# Patient Record
Sex: Male | Born: 1975
Health system: Southern US, Community
[De-identification: ages and names within clinical notes are randomized; demographics above are authoritative.]

## PROBLEM LIST (undated history)

## (undated) DIAGNOSIS — I10 Essential (primary) hypertension: Secondary | ICD-10-CM

## (undated) HISTORY — DX: Essential (primary) hypertension: I10

## (undated) HISTORY — PX: VASECTOMY: SHX75

---

## 2019-05-27 ENCOUNTER — Telehealth: Payer: Self-pay | Admitting: Internal Medicine
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
## 2019-07-12 DIAGNOSIS — Z3009 Encounter for other general counseling and advice on contraception: Secondary | ICD-10-CM | POA: Diagnosis not present

## 2019-08-02 DIAGNOSIS — Z302 Encounter for sterilization: Secondary | ICD-10-CM | POA: Diagnosis not present

## 2019-08-30 ENCOUNTER — Other Ambulatory Visit: Payer: Self-pay

## 2019-08-30 ENCOUNTER — Ambulatory Visit: Payer: Self-pay

## 2019-08-30 DIAGNOSIS — Z23 Encounter for immunization: Secondary | ICD-10-CM

## 2019-09-21 ENCOUNTER — Other Ambulatory Visit: Payer: Self-pay

## 2019-09-21 ENCOUNTER — Emergency Department
Admission: EM | Admit: 2019-09-21 | Discharge: 2019-09-21 | Disposition: A | Discharge status: Home / Self Care | Payer: No Typology Code available for payment source | Attending: Student in an Organized Health Care Education/Training Program | Admitting: Student in an Organized Health Care Education/Training Program

## 2019-09-21 ENCOUNTER — Encounter: Payer: Self-pay | Admitting: Emergency Medicine

## 2019-09-21 DIAGNOSIS — S6991XA Unspecified injury of right wrist, hand and finger(s), initial encounter: Secondary | ICD-10-CM | POA: Diagnosis present

## 2019-09-21 DIAGNOSIS — S60415A Abrasion of left ring finger, initial encounter: Secondary | ICD-10-CM | POA: Insufficient documentation

## 2019-09-21 DIAGNOSIS — Y990 Civilian activity done for income or pay: Secondary | ICD-10-CM | POA: Insufficient documentation

## 2019-09-21 DIAGNOSIS — Y9389 Activity, other specified: Secondary | ICD-10-CM | POA: Insufficient documentation

## 2019-09-21 DIAGNOSIS — Y929 Unspecified place or not applicable: Secondary | ICD-10-CM | POA: Insufficient documentation

## 2019-09-21 DIAGNOSIS — S60311A Abrasion of right thumb, initial encounter: Secondary | ICD-10-CM | POA: Diagnosis not present

## 2019-09-21 DIAGNOSIS — T148XXA Other injury of unspecified body region, initial encounter: Secondary | ICD-10-CM

## 2019-09-21 MED ORDER — SULFAMETHOXAZOLE-TRIMETHOPRIM 800-160 MG PO TABS: 1.0000 | ORAL_TABLET | Freq: Two times a day (BID) | ORAL | 0 refills | Status: DC

## 2019-09-21 MED ORDER — NEOSPORIN PLUS PAIN RELIEF MS 3.5-10000-10 EX CREA: TOPICAL_CREAM | Freq: Two times a day (BID) | CUTANEOUS | 0 refills | Status: DC

## 2019-09-21 MED ORDER — NAPROXEN 500 MG PO TABS: 500.0000 mg | ORAL_TABLET | Freq: Two times a day (BID) | ORAL | Status: DC

## 2019-09-21 MED ORDER — SULFAMETHOXAZOLE-TRIMETHOPRIM 800-160 MG PO TABS
1.0000 | ORAL_TABLET | Freq: Once | ORAL | Status: AC
  Administered 2019-09-21: 1 via ORAL
  Filled 2019-09-21: qty 1

## 2019-09-21 MED ORDER — NAPROXEN 500 MG PO TABS
500.0000 mg | ORAL_TABLET | Freq: Once | ORAL | Status: AC
  Administered 2019-09-21: 500 mg via ORAL
  Filled 2019-09-21: qty 1

## 2019-09-21 MED ORDER — BACITRACIN-NEOMYCIN-POLYMYXIN 400-5-5000 EX OINT
TOPICAL_OINTMENT | Freq: Once | CUTANEOUS | Status: AC
  Administered 2019-09-21: 1 via TOPICAL
  Filled 2019-09-21: qty 1
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
## 2019-09-21 NOTE — Discharge Instructions (Addendum)
Follow discharge care instruction take medication as directed. °

## 2019-10-01 ENCOUNTER — Ambulatory Visit: Payer: Self-pay

## 2019-10-02 ENCOUNTER — Ambulatory Visit: Payer: Self-pay | Admitting: Registered Nurse

## 2019-10-02 ENCOUNTER — Encounter: Payer: Self-pay | Admitting: Registered Nurse

## 2019-10-02 ENCOUNTER — Other Ambulatory Visit: Payer: Self-pay

## 2019-10-02 VITALS — BP 136/88 | HR 65 | Temp 98.4°F | Resp 16 | Ht 68.0 in | Wt 209.0 lb

## 2019-10-02 DIAGNOSIS — L03011 Cellulitis of right finger: Secondary | ICD-10-CM

## 2019-10-02 MED ORDER — MUPIROCIN 2 % EX OINT: 1.0000 "application " | TOPICAL_OINTMENT | Freq: Two times a day (BID) | CUTANEOUS | 0 refills | Status: DC

## 2019-10-02 MED ORDER — CLINDAMYCIN HCL 150 MG PO CAPS: 450.0000 mg | ORAL_CAPSULE | Freq: Three times a day (TID) | ORAL | 0 refills | Status: DC

## 2019-10-02 NOTE — Patient Instructions (Signed)
Paronychia Paronychia is an infection of the skin that surrounds a nail. It usually affects the skin around a fingernail, but it may also occur near a toenail. It often causes pain and swelling around the nail. In some cases, a collection of pus (abscess) can form near or under the nail.  This condition may develop suddenly, or it may develop gradually over a longer period. In most cases, paronychia is not serious, and it will clear up with treatment. What are the causes? This condition may be caused by bacteria or a fungus. These germs can enter the body through an opening in the skin, such as a cut or a hangnail. What increases the risk? This condition is more likely to develop in people who:  Get their hands wet often, such as those who work as dishwashers, bartenders, or nurses.  Bite their fingernails or suck their thumbs.  Trim their nails very short.  Have hangnails or injured fingertips.  Get manicures.  Have diabetes. What are the signs or symptoms? Symptoms of this condition include:  Redness and swelling of the skin near the nail.  Tenderness around the nail when you touch the area.  Pus-filled bumps under the skin at the base and sides of the nail (cuticle).  Fluid or pus under the nail.  Throbbing pain in the area. How is this diagnosed? This condition is diagnosed with a physical exam. In some cases, a sample of pus may be tested to determine what type of bacteria or fungus is causing the condition. How is this treated? Treatment depends on the cause and severity of your condition. If your condition is mild, it may clear up on its own in a few days or after soaking in warm water. If needed, treatment may include:  Antibiotic medicine, if your infection is caused by bacteria.  Antifungal medicine, if your infection is caused by a fungus.  A procedure to drain pus from an abscess.  Anti-inflammatory medicine (corticosteroids). Follow these instructions at  home: Wound care  Keep the affected area clean.  Soak the affected area in warm water, if told to do so by your health care provider. You may be told to do this for 20 minutes, 2-3 times a day.  Keep the area dry when you are not soaking it.  Do not try to drain an abscess yourself.  Follow instructions from your health care provider about how to take care of the affected area. Make sure you: ? Wash your hands with soap and water before you change your bandage (dressing). If soap and water are not available, use hand sanitizer. ? Change your dressing as told by your health care provider.  If you had an abscess drained, check the area every day for signs of infection. Check for: ? Redness, swelling, or pain. ? Fluid or blood. ? Warmth. ? Pus or a bad smell. Medicines   Take over-the-counter and prescription medicines only as told by your health care provider.  If you were prescribed an antibiotic medicine, take it as told by your health care provider. Do not stop taking the antibiotic even if you start to feel better. General instructions  Avoid contact with harsh chemicals.  Do not pick at the affected area. Prevention  To prevent this condition from happening again: ? Wear rubber gloves when washing dishes or doing other tasks that require your hands to get wet. ? Wear gloves if your hands might come in contact with cleaners or other chemicals. ? Avoid   injuring your nails or fingertips. ? Do not bite your nails or tear hangnails. ? Do not cut your nails very short. ? Do not cut your cuticles. ? Use clean nail clippers or scissors when trimming nails. Contact a health care provider if:  Your symptoms get worse or do not improve with treatment.  You have continued or increased fluid, blood, or pus coming from the affected area.  Your finger or knuckle becomes swollen or difficult to move. Get help right away if you have:  A fever or chills.  Redness spreading away from  the affected area.  Joint or muscle pain. Summary  Paronychia is an infection of the skin that surrounds a nail. It often causes pain and swelling around the nail. In some cases, a collection of pus (abscess) can form near or under the nail.  This condition may be caused by bacteria or a fungus. These germs can enter the body through an opening in the skin, such as a cut or a hangnail.  If your condition is mild, it may clear up on its own in a few days. If needed, treatment may include medicine or a procedure to drain pus from an abscess.  To prevent this condition from happening again, wear gloves if doing tasks that require your hands to get wet or to come in contact with chemicals. Also avoid injuring your nails or fingertips. This information is not intended to replace advice given to you by your health care provider. Make sure you discuss any questions you have with your health care provider. Document Released: 04/12/2001 Document Revised: 11/03/2017 Document Reviewed: 10/30/2017 Elsevier Patient Education  2020 [employer] Abrasion  An abrasion is a cut or a scrape on the outer surface of the skin. An abrasion does not go through all the layers of the skin. It is important to care for your abrasion properly to prevent infection. What are the causes? This condition is caused by falling on or gliding across the ground or another surface. When your skin rubs on something, the outer and inner layers of skin may rub off. What are the signs or symptoms? The main symptom of this condition is a cut or a scrape. The scrape may be bleeding, or it may appear red or pink. If the abrasion was caused by a fall, there may be a bruise under the cut or scrape. How is this diagnosed? An abrasion is diagnosed with a physical exam. How is this treated? Treatment for this condition depends on how large and deep the abrasion is. In most cases:  Your abrasion will be cleaned with water and mild soap.  This is done to remove any dirt or debris (such as particles of glass or rock) that may be stuck in the wound.  An antibiotic ointment may be applied to the abrasion to help prevent infection.  A bandage (dressing) may be placed on the abrasion to keep it clean. You may also need a tetanus shot. Follow these instructions at home: Medicines  Take or apply over-the-counter and prescription medicines only as told by your health care provider.  If you were prescribed an antibiotic medicine, apply it as told by your health care provider. Wound care  Clean the wound 2-3 times a day, or as directed by your health care provider. To do this, wash the wound with mild soap and water, rinse off the soap, and pat the wound dry with a clean towel. Do not rub the wound.  Keep the  dressing clean and dry as told by your health care provider.  There are many different ways to close and cover a wound. Follow instructions from your health care provider about: ? Caring for your wound. ? Changing and removing your dressing. You may have to change your dressing one or more times a day, or as directed by your health care provider.  Check your wound every day for signs of infection. Check for: ? Redness, particularly a red streak that spreads out from the wound. ? Swelling or increased pain. ? Warmth. ? Fluid, pus, or a bad smell.  If directed, put ice on the injured area to reduce pain and swelling: ? Put ice in a plastic bag. ? Place a towel between your skin and the bag. ? Leave the ice on for 20 minutes, 2-3 times a day. General instructions  Do not take baths, swim, or use a hot tub until your health care provider says it is okay to do so.  If possible, raise (elevate) the injured area above the level of your heart while you are sitting or lying down. This will reduce pain and swelling.  Keep all follow-up visits as directed by your health care provider. This is important. Contact a health care  provider if:  You received a tetanus shot, and you have swelling, severe pain, redness, or bleeding at the injection site.  Your pain is not controlled with medicine.  You have redness, swelling, or more pain at the site of your wound. Get help right away if:  You have a red streak spreading away from your wound.  You have a fever.  You have fluid, blood, or pus coming from your wound.  You notice a bad smell coming from your wound or your dressing. Summary  An abrasion is a cut or a scrape on the outer surface of the skin. An abrasion does not go through all the layers of the skin.  Care for your abrasion properly to prevent infection.  Clean the wound with mild soap and water 2-3 times a day. Follow instructions from your health care provider about taking medicines and changing your bandage (dressing).  Contact your health care provider if you have redness, swelling or more pain in the wound area.  Get help right away if you have a fever or if you have fluid, blood, pus, a bad smell, or a red streak coming from the wound. This information is not intended to replace advice given to you by your health care provider. Make sure you discuss any questions you have with your health care provider. Document Released: 07/27/2005 Document Revised: 09/29/2017 Document Reviewed: 05/31/2017 Elsevier Patient Education  2020 [employer].

## 2019-10-02 NOTE — Progress Notes (Signed)
Subjective:    Patient ID: Michael Garrison, male    DOB: Aug 29, 1976, 43 y.o.   MRN: [id number]  43y/o hispanic married male established patient here for follow up abrasion right thumb having pain/swollen and purulent drainage still even though taking bactrim DS po BID and naproxen from ER and applying neosporin once a day.  He has been washing with iodine daily also.  Pain especially if he bumps affected area on work gear/putting hand in pocket.  Denied fever/chills/n/v/d/hand weakness.  Feels able to perform all work duties.     Review of Systems  Constitutional: Negative for activity change, appetite change, chills, diaphoresis, fatigue and fever.  HENT: Negative for congestion, trouble swallowing and voice change.   Eyes: Negative for photophobia and visual disturbance.  Respiratory: Negative for cough, shortness of breath, wheezing and stridor.   Cardiovascular: Negative for chest pain and leg swelling.  Gastrointestinal: Negative for abdominal pain, blood in stool, constipation, diarrhea, nausea and vomiting.  Genitourinary: Negative for difficulty urinating.  Musculoskeletal: Positive for myalgias. Negative for arthralgias, back pain, gait problem, joint swelling, neck pain and neck stiffness.  Skin: Positive for color change, rash and wound.  Allergic/Immunologic: Negative for environmental allergies, food allergies and immunocompromised state.  Neurological: Negative for tremors, weakness and numbness.  Hematological: Negative for adenopathy. Does not bruise/bleed easily.  Psychiatric/Behavioral: Negative for agitation, confusion and sleep disturbance.       Objective:   Physical Exam Vitals signs reviewed.  Constitutional:      General: He is awake. He is not in acute distress.    Appearance: Normal appearance. He is well-developed, well-groomed and normal weight. He is not ill-appearing, toxic-appearing or diaphoretic.  HENT:     Head: Normocephalic and atraumatic.   Jaw: There is normal jaw occlusion.     Right Ear: Hearing and external ear normal.     Left Ear: Hearing and external ear normal.     Nose: Nose normal.     Mouth/Throat:     Lips: Pink. No lesions.     Mouth: Mucous membranes are moist. No lacerations, oral lesions or angioedema.     Dentition: No gum lesions.     Tongue: No lesions. Tongue does not deviate from midline.     Palate: No mass and lesions.     Pharynx: Oropharynx is clear. Uvula midline.  Eyes:     General: Lids are normal. Vision grossly intact. Gaze aligned appropriately. No visual field deficit or scleral icterus.    Extraocular Movements: Extraocular movements intact.     Conjunctiva/sclera: Conjunctivae normal.     Pupils: Pupils are equal, round, and reactive to light.  Neck:     Musculoskeletal: Normal range of motion and neck supple. Normal range of motion. No edema, erythema, neck rigidity, crepitus, injury, pain with movement or torticollis.     Trachea: Trachea normal.  Cardiovascular:     Rate and Rhythm: Normal rate and regular rhythm.     Pulses:          Radial pulses are 2+ on the right side and 2+ on the left side.     Heart sounds: Normal heart sounds.  Pulmonary:     Effort: Pulmonary effort is normal. No respiratory distress.     Breath sounds: Normal breath sounds and air entry. No stridor, decreased air movement or transmitted upper airway sounds. No decreased breath sounds, wheezing, rhonchi or rales.     Comments: No cough observed in exam room; spoke full  sentences without difficulty; wearing cloth mask due to covid 19 pandemic Abdominal:     Palpations: Abdomen is soft.  Musculoskeletal: Normal range of motion.        General: Swelling, tenderness, deformity and signs of injury present.     Right shoulder: Normal.     Left shoulder: Normal.     Right elbow: Normal.    Left elbow: Normal.     Right wrist: Normal.     Left wrist: Normal.     Right hip: Normal.     Left hip: Normal.      Right knee: Normal.     Left knee: Normal.     Right forearm: Normal.     Right hand: He exhibits tenderness and swelling. He exhibits normal range of motion, no bony tenderness, normal two-point discrimination, normal capillary refill, no deformity and no laceration. Normal sensation noted. Normal strength noted. He exhibits no finger abduction, no thumb/finger opposition and no wrist extension trouble.     Left hand: Normal.       Hands:     Right lower leg: No edema.     Left lower leg: No edema.     Comments: Full arom all fingers/hands/wrist/shoulders/neck without difficulty bilaterally; hand grasp equal bilaterally 5/5  Lymphadenopathy:     Cervical: No cervical adenopathy.     Right cervical: No superficial cervical adenopathy.    Left cervical: No superficial cervical adenopathy.  Skin:    General: Skin is warm.     Capillary Refill: Capillary refill takes less than 2 seconds.     Coloration: Skin is not ashen, cyanotic, jaundiced, mottled, pale or sallow.     Findings: Abrasion, abscess, erythema, signs of injury, rash and wound present. No acne, bruising, burn, ecchymosis, laceration, lesion or petechiae. Rash is macular, papular, pustular and scaling. Rash is not crusting, nodular, purpuric, urticarial or vesicular.     Nails: There is no clubbing.   Neurological:     General: No focal deficit present.     Mental Status: He is alert and oriented to person, place, and time. Mental status is at baseline.     GCS: GCS eye subscore is 4. GCS verbal subscore is 5. GCS motor subscore is 6.     Cranial Nerves: Cranial nerves are intact. No cranial nerve deficit, dysarthria or facial asymmetry.     Sensory: Sensation is intact. No sensory deficit.     Motor: Motor function is intact. No weakness, tremor, atrophy, abnormal muscle tone or seizure activity.     Coordination: Coordination is intact. Coordination normal.     Gait: Gait is intact. Gait normal.     Comments: Gait sure and  steady in hallway; on/off exam table without difficulty; bilateral hand grasp/upper and lower extremity strentgth 5/5  Psychiatric:        Attention and Perception: Attention and perception normal.        Mood and Affect: Mood and affect normal.        Speech: Speech normal.        Behavior: Behavior normal. Behavior is cooperative.        Thought Content: Thought content normal.        Cognition and Memory: Cognition and memory normal.        Judgment: Judgment normal.    Fingertips calloused with cracked skin both hands       Assessment & Plan:  A-abrasion cellulitis/paronychia right thumb; nail damage and callous/dry skin  Start epsom  salt soaks after work; finish bactrim ds po BID (missed a couple doses); avoid biting hangnails or picking at dry skin; start using emollient TID due to frequent handwashing and hand sanitizer use skin very dry/calloused/scaling; Avoid picking at affected area.  Continue iodine over affected thumb after washing.  Allow iodine to air dry.  Do not use full strength hydrogen peroxide on wounds after initial cleaning after injury as delays wound healing.  Start mupirocin instead of neosporin BID until healed/rash resolved electronic Rx to his pharmacy of choice #1 RF0  If no improvement with 48 hours of epsom salt soaks and mupirocin start clindamycin 450mg  po TID x 7 days #  RF0  May stop at day 5 if all swelling/rash/pain resolved day 5; no restrictions for duty patient feels able to perform all duties.  Epsom salt warm/hot soaks 2-3 times per day for 20 minutes. Monitor for red streaks, fever, worsening pain in affected extremity.  Purulent discharge may increase in the next 24 hours but should decrease and resolve with new ointment, epsom salt soaks, and oral antibiotics. Follow up with provider if worsening pain, red streaks, pain, and discharge after taking clindamycin for 48 hours or if pain swelling develops in his hand also. Patient given printed Exit[employer]  handout on paronychia. Patient verbalized understanding of instructions, agreed with plan of care and had no further questions at this time.

## 2019-10-08 ENCOUNTER — Telehealth: Payer: Self-pay | Admitting: General Practice

## 2019-10-08 DIAGNOSIS — L03011 Cellulitis of right finger: Secondary | ICD-10-CM

## 2019-10-08 DIAGNOSIS — R197 Diarrhea, unspecified: Secondary | ICD-10-CM

## 2019-10-08 NOTE — Telephone Encounter (Signed)
He was prescribed Clindamycin hcl for his infected finger. He has diarrhea as a side effect. He read that diarrhea is a side effect on the information sheet he was given at the pharmacy. He stopped taking it last night. He has had diarrhea since last Thursday.

## 2019-10-08 NOTE — Telephone Encounter (Signed)
Patient contacted via telephone.  Patient started having diarrhea the day after he started clindamycin Thursday last week.  He thought he had eaten bad chicken nuggets at [facility] but finally last night realized diarrhea worsened after taking clindamycin and read on medication information sheet that diarrhea can be a side effect and contacted clinic today.  He has been hydrating with powerade/pedialyte/chicken noodle soup.  At home today day off.  Feeling a little better.  Denied dizzyness/vomiting; voiding regularly.  His thumb infection doing better with mupirocin and clindamycin 60-70% healed per patient.  Going to hold oral antibiotics due to diarrhea and patient to continue mupirocin topical BID and restart epsom salt soaks BID until cellulitis resolved.  If worsening patient to contact clinic or if diarrhea not resolving with immodium 2mg  po prn take after a stool up to 8 tabs (16mg ) per 24 hours maximum OTC or peptobismol per manufacturer instructions.  Patient has peptobismol at home but has not taken it yet.  Discussed hydration most important and avoid large portions dairy/meat/juice/spicy/fried foods as these can worsen diarrhea.  Follow up if no improvement with plan of care x 24 hours or if thumb infection worsening e.g. increased pain/swelling, purulent discharge.  Patient denied discharge since I saw him and pain and swelling resolving.  Patient verbalized understanding information/instructions, agreed with plan of care and had no further questions at this time.

## 2019-10-11 DIAGNOSIS — S62002A Unspecified fracture of navicular [scaphoid] bone of left wrist, initial encounter for closed fracture: Secondary | ICD-10-CM | POA: Diagnosis not present

## 2019-10-11 DIAGNOSIS — M25461 Effusion, right knee: Secondary | ICD-10-CM | POA: Diagnosis not present

## 2019-10-11 DIAGNOSIS — M25462 Effusion, left knee: Secondary | ICD-10-CM | POA: Diagnosis not present

## 2020-04-09 NOTE — Progress Notes (Signed)
Scheduled to complete physical 04/20/20 with Rhonda Summers, PA-C.  AMD 

## 2020-04-10 ENCOUNTER — Other Ambulatory Visit: Payer: Self-pay

## 2020-04-10 ENCOUNTER — Ambulatory Visit: Payer: Self-pay

## 2020-04-10 DIAGNOSIS — Z0000 Encounter for general adult medical examination without abnormal findings: Secondary | ICD-10-CM

## 2020-04-10 LAB — POCT URINALYSIS DIPSTICK
Bilirubin, UA: NEGATIVE
Blood, UA: NEGATIVE
Glucose, UA: NEGATIVE
Ketones, UA: NEGATIVE
Leukocytes, UA: NEGATIVE
Nitrite, UA: NEGATIVE
Protein, UA: NEGATIVE
Spec Grav, UA: 1.015 (ref 1.010–1.025)
Urobilinogen, UA: 0.2 E.U./dL
pH, UA: 6 (ref 5.0–8.0)

## 2020-04-11 LAB — CMP12+LP+TP+TSH+6AC+PSA+CBC…
ALT: 32 IU/L (ref 0–44)
AST: 24 IU/L (ref 0–40)
Albumin/Globulin Ratio: 1.9 (ref 1.2–2.2)
Albumin: 4.6 g/dL (ref 4.0–5.0)
Alkaline Phosphatase: 70 IU/L (ref 48–121)
BUN/Creatinine Ratio: 18 (ref 9–20)
BUN: 16 mg/dL (ref 6–24)
Basophils Absolute: 0 10*3/uL (ref 0.0–0.2)
Basos: 0 %
Bilirubin Total: 0.6 mg/dL (ref 0.0–1.2)
Calcium: 9.2 mg/dL (ref 8.7–10.2)
Chloride: 103 mmol/L (ref 96–106)
Chol/HDL Ratio: 3.4 ratio (ref 0.0–5.0)
Cholesterol, Total: 135 mg/dL (ref 100–199)
Creatinine, Ser: 0.87 mg/dL (ref 0.76–1.27)
EOS (ABSOLUTE): 0.1 10*3/uL (ref 0.0–0.4)
Eos: 2 %
Estimated CHD Risk: 0.5 times avg. (ref 0.0–1.0)
Free Thyroxine Index: 1.8 (ref 1.2–4.9)
GFR calc Af Amer: 120 mL/min/{1.73_m2} (ref >59)
GFR calc non Af Amer: 104 mL/min/{1.73_m2} (ref >59)
GGT: 23 IU/L (ref 0–65)
Globulin, Total: 2.4 g/dL (ref 1.5–4.5)
Glucose: 73 mg/dL (ref 65–99)
HDL: 40 mg/dL (ref >39)
Hematocrit: 42.7 % (ref 37.5–51.0)
Hemoglobin: 14.8 g/dL (ref 13.0–17.7)
Immature Grans (Abs): 0 10*3/uL (ref 0.0–0.1)
Immature Granulocytes: 0 %
Iron: 121 ug/dL (ref 38–169)
LDH: 211 IU/L (ref 121–224)
LDL Chol Calc (NIH): 73 mg/dL (ref 0–99)
Lymphocytes Absolute: 2.3 10*3/uL (ref 0.7–3.1)
Lymphs: 35 %
MCH: 32.2 pg (ref 26.6–33.0)
MCHC: 34.7 g/dL (ref 31.5–35.7)
MCV: 93 fL (ref 79–97)
Monocytes Absolute: 0.5 10*3/uL (ref 0.1–0.9)
Monocytes: 7 %
Neutrophils Absolute: 3.7 10*3/uL (ref 1.4–7.0)
Neutrophils: 56 %
Phosphorus: 3.3 mg/dL (ref 2.8–4.1)
Platelets: 175 10*3/uL (ref 150–450)
Potassium: 4.6 mmol/L (ref 3.5–5.2)
Prostate Specific Ag, Serum: 0.4 ng/mL (ref 0.0–4.0)
RBC: 4.6 x10E6/uL (ref 4.14–5.80)
RDW: 12.2 % (ref 11.6–15.4)
Sodium: 140 mmol/L (ref 134–144)
T3 Uptake Ratio: 27 % (ref 24–39)
T4, Total: 6.8 ug/dL (ref 4.5–12.0)
TSH: 0.687 u[IU]/mL (ref 0.450–4.500)
Total Protein: 7 g/dL (ref 6.0–8.5)
Triglycerides: 120 mg/dL (ref 0–149)
Uric Acid: 7.1 mg/dL (ref 3.8–8.4)
VLDL Cholesterol Cal: 22 mg/dL (ref 5–40)
WBC: 6.7 10*3/uL (ref 3.4–10.8)

## 2020-04-20 ENCOUNTER — Other Ambulatory Visit: Payer: Self-pay

## 2020-04-20 ENCOUNTER — Ambulatory Visit: Payer: Self-pay | Admitting: Emergency Medicine

## 2020-04-20 ENCOUNTER — Encounter: Payer: Self-pay | Admitting: Emergency Medicine

## 2020-04-20 VITALS — BP 148/86 | HR 54 | Temp 97.8°F | Resp 14 | Ht 67.0 in | Wt 183.0 lb

## 2020-04-20 DIAGNOSIS — Z0000 Encounter for general adult medical examination without abnormal findings: Secondary | ICD-10-CM

## 2020-04-20 DIAGNOSIS — B360 Pityriasis versicolor: Secondary | ICD-10-CM

## 2020-04-20 MED ORDER — SELENIUM SULFIDE 2.5 % EX LOTN: TOPICAL_LOTION | Freq: Every day | CUTANEOUS | Status: DC | PRN

## 2020-04-20 NOTE — Progress Notes (Signed)
I have reviewed the triage vital signs and the nursing notes.   HISTORY  Chief Complaint Employment Physical  HPI Michael Garrison is a 44 y.o. male for annual exam.       No past medical history on file.  There are no problems to display for this patient.   Past Surgical History:  Procedure Laterality Date  . VASECTOMY      Prior to Admission medications   Medication Sig Start Date End Date Taking? Authorizing Provider  mupirocin ointment (BACTROBAN) 2 % Apply 1 application topically 2 (two) times daily. Patient not taking: Reported on 04/20/2020 10/02/19   Barbaraann Barthel, NP  naproxen (NAPROSYN) 500 MG tablet Take 1 tablet (500 mg total) by mouth 2 (two) times daily with a meal. Patient not taking: Reported on 04/20/2020 09/21/19   Joni Reining, PA-C  neomycin-polymyxin-pramoxine (NEOSPORIN PLUS) 1 % cream Apply topically 2 (two) times daily. Patient not taking: Reported on 10/08/2019 09/21/19   Joni Reining, PA-C  sulfamethoxazole-trimethoprim (BACTRIM DS) 800-160 MG tablet Take 1 tablet by mouth 2 (two) times daily. Patient not taking: Reported on 10/08/2019 09/21/19   Joni Reining, PA-C    Allergies Patient has no known allergies.  No family history on file.  Social History Social History   Tobacco Use  . Smoking status: Never Smoker  . Smokeless tobacco: Never Used  Vaping Use  . Vaping Use: Never used  Substance Use Topics  . Alcohol use: Yes    Alcohol/week: 2.0 standard drinks    Types: 2 Cans of beer per week    Comment: Jul 2020  . Drug use: Never    Review of Systems Constitutional: No fever/chills Eyes: No visual changes. ENT: No sore throat. Cardiovascular: Denies chest pain. Respiratory: Denies shortness of breath. Gastrointestinal: No abdominal pain.  No nausea, no vomiting.  No diarrhea.  No constipation. Genitourinary: Negative for dysuria. Musculoskeletal: Negative for back pain. Skin: Negative for rash. Neurological:  Negative for headaches, focal weakness or numbness. ____________________________________________   PHYSICAL EXAM: Constitutional: Alert and oriented. Well appearing and in no acute distress. Eyes: Conjunctivae are normal. PERRL. EOMI. Head: Atraumatic. Nose: No congestion/rhinnorhea. Neck: No stridor.  No cervical tenderness on palpation posteriorly. Hematological/Lymphatic/Immunilogical: No cervical lymphadenopathy. Cardiovascular: Normal rate, regular rhythm. Grossly normal heart sounds.  Good peripheral circulation. Respiratory: Normal respiratory effort.  No retractions. Lungs CTAB. Gastrointestinal: Soft and nontender. No distention.  Bowel sounds normoactive x4 quadrants. Musculoskeletal: Moves upper and lower extremities with any difficulty.  There is no tenderness on palpation of the thoracic or lumbar spine.  Good muscle strength bilaterally. Neurologic:  Normal speech and language. No gross focal neurologic deficits are appreciated. No gait instability. Skin:  Skin is warm, dry and intact.  Irregular shaped rash noted to the torso and upper arms.  Rash is consistent however in the areas that have received more sun the color of the rash is lighter than what is under his shirt.  Questionable for tinea versicolor. Psychiatric: Mood and affect are normal. Speech and behavior are normal.  ____________________________________________   LABS (all labs ordered are listed, but only abnormal results are displayed) Labs were reviewed with patient especially his PSA which was normal. ____________________________________________  EKG Sinus bradycardia with ventricular rate of 54.  ____________________________________________ ____________________________________________   FINAL CLINICAL IMPRESSION(S)  Encounter for annual physical exam. Skin rash, questionable tinea versicolor   ED Discharge Orders         Ordered    EKG  12-Lead        04/20/20 0932           Note:  This  document was prepared using Dragon voice recognition software and may include unintentional dictation errors.

## 2020-04-30 ENCOUNTER — Other Ambulatory Visit: Payer: Self-pay

## 2020-04-30 ENCOUNTER — Other Ambulatory Visit: Payer: Self-pay | Admitting: Emergency Medicine

## 2020-04-30 DIAGNOSIS — B360 Pityriasis versicolor: Secondary | ICD-10-CM

## 2020-05-01 ENCOUNTER — Other Ambulatory Visit: Payer: Self-pay

## 2020-05-01 DIAGNOSIS — B360 Pityriasis versicolor: Secondary | ICD-10-CM

## 2020-05-02 MED ORDER — SELENIUM SULFIDE 2.5 % EX LOTN: 1.0000 "application " | TOPICAL_LOTION | Freq: Every day | CUTANEOUS | 12 refills | Status: DC | PRN

## 2020-10-01 ENCOUNTER — Emergency Department
Admission: EM | Admit: 2020-10-01 | Discharge: 2020-10-01 | Disposition: A | Discharge status: Home / Self Care | Payer: 59 | Attending: Emergency Medicine | Admitting: Emergency Medicine

## 2020-10-01 ENCOUNTER — Emergency Department: Payer: 59

## 2020-10-01 ENCOUNTER — Other Ambulatory Visit: Payer: Self-pay

## 2020-10-01 DIAGNOSIS — R0789 Other chest pain: Secondary | ICD-10-CM | POA: Diagnosis not present

## 2020-10-01 DIAGNOSIS — Z0389 Encounter for observation for other suspected diseases and conditions ruled out: Secondary | ICD-10-CM | POA: Diagnosis not present

## 2020-10-01 LAB — BASIC METABOLIC PANEL
Anion gap: 10 (ref 5–15)
BUN: 14 mg/dL (ref 6–20)
CO2: 27 mmol/L (ref 22–32)
Calcium: 9.3 mg/dL (ref 8.9–10.3)
Chloride: 101 mmol/L (ref 98–111)
Creatinine, Ser: 0.88 mg/dL (ref 0.61–1.24)
GFR, Estimated: >60 mL/min (ref >60)
Glucose, Bld: 100 mg/dL — ABNORMAL HIGH (ref 70–99)
Potassium: 4 mmol/L (ref 3.5–5.1)
Sodium: 138 mmol/L (ref 135–145)

## 2020-10-01 LAB — CBC
HCT: 43.7 % (ref 39.0–52.0)
Hemoglobin: 15.5 g/dL (ref 13.0–17.0)
MCH: 32.3 pg (ref 26.0–34.0)
MCHC: 35.5 g/dL (ref 30.0–36.0)
MCV: 91 fL (ref 80.0–100.0)
Platelets: 195 10*3/uL (ref 150–400)
RBC: 4.8 MIL/uL (ref 4.22–5.81)
RDW: 13.6 % (ref 11.5–15.5)
WBC: 7.5 10*3/uL (ref 4.0–10.5)
nRBC: 0 % (ref 0.0–0.2)

## 2020-10-01 LAB — TROPONIN I (HIGH SENSITIVITY)
Troponin I (High Sensitivity): 4 ng/L (ref <18)
Troponin I (High Sensitivity): 3 ng/L (ref <18)

## 2020-10-01 NOTE — ED Triage Notes (Signed)
Pt to ED POV for left sided cp that started after covid shot on novemeber, pt stated pain was intermittent in November, now the pain today is "every 5 minutes"  Denies N/V Reports some SHOB since shot with activity at work NAD, RR Even and unlabored

## 2020-10-01 NOTE — ED Provider Notes (Signed)
[facility] Emergency Department Provider Note    First MD Initiated Contact with Patient 10/01/20 1232     (approximate)  I have reviewed the triage vital signs and the nursing notes.   HISTORY  Chief Complaint Chest Pain    HPI Ephrem Carrick is a 44 y.o. male   no significant past medical history presents to the ER for evaluation of left-sided anterior chest wall pain.  Describes it as stabbing and burning sensation lasting only a few moments and has been intermittent stuttering for the past several days.  States that he feels like this all started back to when he received a booster shot on October 10.  States he felt like he was having some soreness after the shot and has been having intermittent left-sided pain since then.  Denies any positional changes.  No fevers.  No shortness of breath.  No cough.  No history of cardiac illness.  Denies any pain with deep inspiration.  States his blood pressure always runs high but is never been started on blood pressure medication.  He does not smoke.  No history of high cholesterol or diabetes.   History reviewed. No pertinent past medical history. No family history on file. Past Surgical History:  Procedure Laterality Date  . VASECTOMY     There are no problems to display for this patient.     Prior to Admission medications   Medication Sig Start Date End Date Taking? Authorizing Provider  neomycin-polymyxin-pramoxine (NEOSPORIN PLUS) 1 % cream Apply topically 2 (two) times daily. Patient not taking: Reported on 10/08/2019 09/21/19   Joni Reining, PA-C  selenium sulfide (SELSUN) 2.5 % shampoo Apply 1 application topically daily as needed for irritation. 05/02/20   Emily Filbert, MD    Allergies Patient has no known allergies.    Social History Social History   Tobacco Use  . Smoking status: Never Smoker  . Smokeless tobacco: Never Used  Vaping Use  . Vaping Use: Never used  Substance Use  Topics  . Alcohol use: Yes    Alcohol/week: 2.0 standard drinks    Types: 2 Cans of beer per week    Comment: Jul 2020  . Drug use: Never    Review of Systems Patient denies headaches, rhinorrhea, blurry vision, numbness, shortness of breath, chest pain, edema, cough, abdominal pain, nausea, vomiting, diarrhea, dysuria, fevers, rashes or hallucinations unless otherwise stated above in HPI. ____________________________________________   PHYSICAL EXAM:  VITAL SIGNS: Vitals:   10/01/20 0952 10/01/20 1254  BP: (!) 159/95 (!) 155/95  Pulse: 66 65  Resp: 20 18  Temp: 98.9 F (37.2 C)   SpO2: 98% 100%    Constitutional: Alert and oriented.  Eyes: Conjunctivae are normal.  Head: Atraumatic. Nose: No congestion/rhinnorhea. Mouth/Throat: Mucous membranes are moist.   Neck: No stridor. Painless ROM.  Cardiovascular: Normal rate, regular rhythm. Grossly normal heart sounds.  Good peripheral circulation. Respiratory: Normal respiratory effort.  No retractions. Lungs CTAB. Gastrointestinal: Soft and nontender. No distention. No abdominal bruits. No CVA tenderness. Genitourinary:  Musculoskeletal: No lower extremity tenderness nor edema.  No joint effusions. Neurologic:  Normal speech and language. No gross focal neurologic deficits are appreciated. No facial droop Skin:  Skin is warm, dry and intact. No rash noted. Psychiatric: Mood and affect are normal. Speech and behavior are normal.  ____________________________________________   LABS (all labs ordered are listed, but only abnormal results are displayed)  Results for orders placed or performed during the  hospital encounter of 10/01/20 (from the past 24 hour(s))  Basic metabolic panel     Status: Abnormal   Collection Time: 10/01/20  9:53 AM  Result Value Ref Range   Sodium 138 135 - 145 mmol/L   Potassium 4.0 3.5 - 5.1 mmol/L   Chloride 101 98 - 111 mmol/L   CO2 27 22 - 32 mmol/L   Glucose, Bld 100 (H) 70 - 99 mg/dL   BUN  14 6 - 20 mg/dL   Creatinine, Ser 5.42 0.61 - 1.24 mg/dL   Calcium 9.3 8.9 - 70.6 mg/dL   GFR, Estimated >23 >76 mL/min   Anion gap 10 5 - 15  CBC     Status: None   Collection Time: 10/01/20  9:53 AM  Result Value Ref Range   WBC 7.5 4.0 - 10.5 K/uL   RBC 4.80 4.22 - 5.81 MIL/uL   Hemoglobin 15.5 13.0 - 17.0 g/dL   HCT 28.3 39 - 52 %   MCV 91.0 80.0 - 100.0 fL   MCH 32.3 26.0 - 34.0 pg   MCHC 35.5 30.0 - 36.0 g/dL   RDW 15.1 76.1 - 60.7 %   Platelets 195 150 - 400 K/uL   nRBC 0.0 0.0 - 0.2 %  Troponin I (High Sensitivity)     Status: None   Collection Time: 10/01/20  9:53 AM  Result Value Ref Range   Troponin I (High Sensitivity) 4 <18 ng/L   ____________________________________________  EKG My review and personal interpretation at Time: 9:41   Indication: chest pain  Rate: 65  Rhythm: sinus Axis: normal Other: normal intervals, no stemi, no depressions ____________________________________________  RADIOLOGY  I personally reviewed all radiographic images ordered to evaluate for the above acute complaints and reviewed radiology reports and findings.  These findings were personally discussed with the patient.  Please see medical record for radiology report.  ____________________________________________   PROCEDURES  Procedure(s) performed:  Procedures    Critical Care performed: no ____________________________________________   INITIAL IMPRESSION / ASSESSMENT AND PLAN / ED COURSE  Pertinent labs & imaging results that were available during my care of the patient were reviewed by me and considered in my medical decision making (see chart for details).   DDX: ACS, pericarditis, esophagitis, boerhaaves, pe, dissection, pna, bronchitis, costochondritis   Clif Serio is a 44 y.o. who presents to the ED with chest discomfort as described above.  Patient is well-appearing mildly hypertensive but not tachycardic no fever.  No sign of infectious process.  He is low  risk by Wells criteria and is PERC negative.  This not consistent with dissection he has good perfusion throughout.  No overlying rash to suggest shingles.  Symptoms do not seem consistent with pericarditis or myocarditis given negative enzymes.  He is low risk by heart score and is to negative enzymes.  Not consistent with ACS.  Given his age will give referral to cardiology for close outpatient follow-up and further management.  Have discussed with the patient and available family all diagnostics and treatments performed thus far and all questions were answered to the best of my ability. The patient demonstrates understanding and agreement with plan.      The patient was evaluated in Emergency Department today for the symptoms described in the history of present illness. He/she was evaluated in the context of the global COVID-19 pandemic, which necessitated consideration that the patient might be at risk for infection with the SARS-CoV-2 virus that causes COVID-19. Institutional protocols and algorithms  that pertain to the evaluation of patients at risk for COVID-19 are in a state of rapid change based on information released by regulatory bodies including the CDC and federal and state organizations. These policies and algorithms were followed during the patient's care in the ED.  As part of my medical decision making, I reviewed the following data within the electronic MEDICAL RECORD NUMBER Nursing notes reviewed and incorporated, Labs reviewed, notes from prior ED visits and [place] Controlled Substance Database   ____________________________________________   FINAL CLINICAL IMPRESSION(S) / ED DIAGNOSES  Final diagnoses:  Atypical chest pain      NEW MEDICATIONS STARTED DURING THIS VISIT:  New Prescriptions   No medications on file     Note:  This document was prepared using Dragon voice recognition software and may include unintentional dictation errors.    Willy Eddy, MD 10/01/20  1331

## 2020-10-07 DIAGNOSIS — I10 Essential (primary) hypertension: Secondary | ICD-10-CM | POA: Insufficient documentation

## 2020-10-07 DIAGNOSIS — R079 Chest pain, unspecified: Secondary | ICD-10-CM | POA: Insufficient documentation

## 2021-03-05 NOTE — Progress Notes (Signed)
Scheduled to complete physical 03/16/21 with Viviano Simas, FNP.  AMD

## 2021-03-08 ENCOUNTER — Ambulatory Visit: Payer: Self-pay

## 2021-03-08 ENCOUNTER — Other Ambulatory Visit: Payer: Self-pay

## 2021-03-08 DIAGNOSIS — Z0000 Encounter for general adult medical examination without abnormal findings: Secondary | ICD-10-CM

## 2021-03-08 LAB — POCT URINALYSIS DIPSTICK
Bilirubin, UA: NEGATIVE
Blood, UA: NEGATIVE
Glucose, UA: NEGATIVE
Ketones, UA: NEGATIVE
Leukocytes, UA: NEGATIVE
Nitrite, UA: NEGATIVE
Protein, UA: NEGATIVE
Spec Grav, UA: 1.01 (ref 1.010–1.025)
Urobilinogen, UA: 0.2 E.U./dL
pH, UA: 6 (ref 5.0–8.0)

## 2021-03-09 LAB — CMP12+LP+TP+TSH+6AC+PSA+CBC…
ALT: 40 IU/L (ref 0–44)
AST: 29 IU/L (ref 0–40)
Albumin/Globulin Ratio: 2 (ref 1.2–2.2)
Albumin: 4.8 g/dL (ref 4.0–5.0)
Alkaline Phosphatase: 86 IU/L (ref 44–121)
BUN/Creatinine Ratio: 14 (ref 9–20)
BUN: 12 mg/dL (ref 6–24)
Basophils Absolute: 0 10*3/uL (ref 0.0–0.2)
Basos: 1 %
Bilirubin Total: 0.6 mg/dL (ref 0.0–1.2)
Calcium: 9.7 mg/dL (ref 8.7–10.2)
Chloride: 102 mmol/L (ref 96–106)
Chol/HDL Ratio: 3.7 ratio (ref 0.0–5.0)
Cholesterol, Total: 147 mg/dL (ref 100–199)
Creatinine, Ser: 0.87 mg/dL (ref 0.76–1.27)
EOS (ABSOLUTE): 0.2 10*3/uL (ref 0.0–0.4)
Eos: 2 %
Estimated CHD Risk: 0.6 times avg. (ref 0.0–1.0)
Free Thyroxine Index: 1.8 (ref 1.2–4.9)
GGT: 31 IU/L (ref 0–65)
Globulin, Total: 2.4 g/dL (ref 1.5–4.5)
Glucose: 85 mg/dL (ref 65–99)
HDL: 40 mg/dL (ref >39)
Hematocrit: 44.5 % (ref 37.5–51.0)
Hemoglobin: 15.7 g/dL (ref 13.0–17.7)
Immature Grans (Abs): 0 10*3/uL (ref 0.0–0.1)
Immature Granulocytes: 0 %
Iron: 110 ug/dL (ref 38–169)
LDH: 198 IU/L (ref 121–224)
LDL Chol Calc (NIH): 76 mg/dL (ref 0–99)
Lymphocytes Absolute: 2.2 10*3/uL (ref 0.7–3.1)
Lymphs: 31 %
MCH: 31.9 pg (ref 26.6–33.0)
MCHC: 35.3 g/dL (ref 31.5–35.7)
MCV: 90 fL (ref 79–97)
Monocytes Absolute: 0.6 10*3/uL (ref 0.1–0.9)
Monocytes: 8 %
Neutrophils Absolute: 4.1 10*3/uL (ref 1.4–7.0)
Neutrophils: 58 %
Phosphorus: 3.2 mg/dL (ref 2.8–4.1)
Platelets: 202 10*3/uL (ref 150–450)
Potassium: 4 mmol/L (ref 3.5–5.2)
Prostate Specific Ag, Serum: 0.5 ng/mL (ref 0.0–4.0)
RBC: 4.92 x10E6/uL (ref 4.14–5.80)
RDW: 12.6 % (ref 11.6–15.4)
Sodium: 140 mmol/L (ref 134–144)
T3 Uptake Ratio: 26 % (ref 24–39)
T4, Total: 7.1 ug/dL (ref 4.5–12.0)
TSH: 1.07 u[IU]/mL (ref 0.450–4.500)
Total Protein: 7.2 g/dL (ref 6.0–8.5)
Triglycerides: 182 mg/dL — ABNORMAL HIGH (ref 0–149)
Uric Acid: 6.8 mg/dL (ref 3.8–8.4)
VLDL Cholesterol Cal: 31 mg/dL (ref 5–40)
WBC: 7.1 10*3/uL (ref 3.4–10.8)
eGFR: 108 mL/min/{1.73_m2} (ref >59)

## 2021-03-16 ENCOUNTER — Other Ambulatory Visit: Payer: Self-pay

## 2021-03-16 ENCOUNTER — Ambulatory Visit: Payer: Self-pay | Admitting: Nurse Practitioner

## 2021-03-16 ENCOUNTER — Encounter: Payer: Self-pay | Admitting: Nurse Practitioner

## 2021-03-16 VITALS — BP 138/86 | HR 60 | Temp 97.6°F | Resp 14 | Ht 68.0 in | Wt 178.0 lb

## 2021-03-16 DIAGNOSIS — R21 Rash and other nonspecific skin eruption: Secondary | ICD-10-CM

## 2021-03-16 DIAGNOSIS — H02402 Unspecified ptosis of left eyelid: Secondary | ICD-10-CM

## 2021-03-16 DIAGNOSIS — Z0000 Encounter for general adult medical examination without abnormal findings: Secondary | ICD-10-CM

## 2021-03-16 NOTE — Progress Notes (Signed)
Subjective:    Patient ID: Michael Garrison, male    DOB: 09-10-1976, 45 y.o.   MRN: [id number]  HPI  45 year old male presenting to [place] for annual physical.   Patient notes that he has had a rash on left abdomen and back for 5+ years. Has tried creams without relief. Rash will itch at times and worsens when he is hot and sweaty. 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 Requesting dermatology referral today.   He is also concerned because he has had a left eyelid droop since he was 18. This worsens when he is tired. He has concern over Myasthenia Gravis. He does see an eye doctor annually and wears glasses for reading.   Denies a family history of neurological disease.   Lost his father to [place] in 2021, will be traveling home to [place] in June related to this.   Denies other health concerns today.   Today's Vitals   03/16/21 1110  BP: 138/86  Pulse: 60  Resp: 14  Temp: 97.6 F (36.4 C)  SpO2: 98%  Weight: 178 lb (80.7 kg)  Height: _0  (1.727 m)   Body mass index is 27.06 kg/m.  Review of Systems  Constitutional: Negative.   HENT: Negative.   Eyes:       Left eyelid droop  Respiratory: Negative.   Cardiovascular: Negative.   Gastrointestinal: Negative.   Genitourinary: Negative.   Musculoskeletal: Negative.   Skin: Positive for rash.  Neurological: Negative.   Psychiatric/Behavioral: Negative.        Objective:   Physical Exam Constitutional:      Appearance: Normal appearance.  HENT:     Head: Normocephalic.     Right Ear: Tympanic membrane, ear canal and external ear normal.     Left Ear: Tympanic membrane, ear canal and external ear normal.     Nose: Nose normal.     Mouth/Throat:     Mouth: Mucous membranes are moist.  Eyes:     Pupils: Pupils are equal, round, and reactive to light.  Cardiovascular:     Rate and Rhythm: Normal rate and regular rhythm.     Heart sounds: Normal heart sounds.  Pulmonary:     Effort: Pulmonary  effort is normal.     Breath sounds: Normal breath sounds.  Abdominal:     General: Abdomen is flat.  Musculoskeletal:        General: Normal range of motion.     Cervical back: Normal range of motion.  Skin:    General: Skin is warm.     Findings: Rash present. Rash is macular.          Comments: Macular rash to left lower abdomen and back crossing midline on back. Rash is slightly darker than skin color flat and without irritation. Dry no drainage or flaking of skin  Neurological:     General: No focal deficit present.     Mental Status: He is alert.     Cranial Nerves: Cranial nerves are intact.     Sensory: Sensation is intact.     Motor: Motor function is intact.     Coordination: Coordination is intact. Romberg sign negative. Heel to [facility] Test normal.     Gait: Gait is intact.     Comments: Left eyelid droop mild at rest today, Patient has control and may widen eye gaze on command  PERRLA EOMI  Psychiatric:  Mood and Affect: Mood normal.    Recent Results (from the past 2160 hour(s))  CMP12+LP+TP+TSH+6AC+PSA+CBC.     Status: Abnormal   Collection Time: 03/08/21  8:55 AM  Result Value Ref Range   Glucose 85 65 - 99 mg/dL   Uric Acid 6.8 3.8 - 8.4 mg/dL    Comment:            Therapeutic target for gout patients: <6.0   BUN 12 6 - 24 mg/dL   Creatinine, Ser 0.87 0.76 - 1.27 mg/dL   eGFR 108 >59 mL/min/1.73   BUN/Creatinine Ratio 14 9 - 20   Sodium 140 134 - 144 mmol/L   Potassium 4.0 3.5 - 5.2 mmol/L   Chloride 102 96 - 106 mmol/L   Calcium 9.7 8.7 - 10.2 mg/dL   Phosphorus 3.2 2.8 - 4.1 mg/dL   Total Protein 7.2 6.0 - 8.5 g/dL   Albumin 4.8 4.0 - 5.0 g/dL   Globulin, Total 2.4 1.5 - 4.5 g/dL   Albumin/Globulin Ratio 2.0 1.2 - 2.2   Bilirubin Total 0.6 0.0 - 1.2 mg/dL   Alkaline Phosphatase 86 44 - 121 IU/L   LDH 198 121 - 224 IU/L   AST 29 0 - 40 IU/L   ALT 40 0 - 44 IU/L   GGT 31 0 - 65 IU/L   Iron 110 38 - 169 ug/dL   Cholesterol, Total 147 100 - 199  mg/dL   Triglycerides 182 (H) 0 - 149 mg/dL   HDL 40 >39 mg/dL   VLDL Cholesterol Cal 31 5 - 40 mg/dL   LDL Chol Calc (NIH) 76 0 - 99 mg/dL   Chol/HDL Ratio 3.7 0.0 - 5.0 ratio    Comment:                                   T. Chol/HDL Ratio                                             Men  Women                               1/2 Avg.Risk  3.4    3.3                                   Avg.Risk  5.0    4.4                                2X Avg.Risk  9.6    7.1                                3X Avg.Risk 23.4   11.0    Estimated CHD Risk 0.6 0.0 - 1.0 times avg.    Comment: The CHD Risk is based on the T. Chol/HDL ratio. Other factors affect CHD Risk such as hypertension, smoking, diabetes, severe obesity, and family history of premature CHD.    TSH 1.070 0.450 - 4.500 uIU/mL   T4, Total 7.1 4.5 - 12.0 ug/dL   T3  Uptake Ratio 26 24 - 39 %   Free Thyroxine Index 1.8 1.2 - 4.9   Prostate Specific Ag, Serum 0.5 0.0 - 4.0 ng/mL    Comment: Roche ECLIA methodology. According to the American Urological Association, Serum PSA should decrease and remain at undetectable levels after radical prostatectomy. The AUA defines biochemical recurrence as an initial PSA value 0.2 ng/mL or greater followed by a subsequent confirmatory PSA value 0.2 ng/mL or greater. Values obtained with different assay methods or kits cannot be used interchangeably. Results cannot be interpreted as absolute evidence of the presence or absence of malignant disease.    WBC 7.1 3.4 - 10.8 x10E3/uL   RBC 4.92 4.14 - 5.80 x10E6/uL   Hemoglobin 15.7 13.0 - 17.7 g/dL   Hematocrit 44.5 37.5 - 51.0 %   MCV 90 79 - 97 fL   MCH 31.9 26.6 - 33.0 pg   MCHC 35.3 31.5 - 35.7 g/dL   RDW 12.6 11.6 - 15.4 %   Platelets 202 150 - 450 x10E3/uL   Neutrophils 58 Not Estab. %   Lymphs 31 Not Estab. %   Monocytes 8 Not Estab. %   Eos 2 Not Estab. %   Basos 1 Not Estab. %   Neutrophils Absolute 4.1 1.4 - 7.0 x10E3/uL   Lymphocytes  Absolute 2.2 0.7 - 3.1 x10E3/uL   Monocytes Absolute 0.6 0.1 - 0.9 x10E3/uL   EOS (ABSOLUTE) 0.2 0.0 - 0.4 x10E3/uL   Basophils Absolute 0.0 0.0 - 0.2 x10E3/uL   Immature Granulocytes 0 Not Estab. %   Immature Grans (Abs) 0.0 0.0 - 0.1 x10E3/uL  POCT urinalysis dipstick     Status: None   Collection Time: 03/08/21  9:14 AM  Result Value Ref Range   Color, UA Light Yellow    Clarity, UA Clear    Glucose, UA Negative Negative   Bilirubin, UA Negative    Ketones, UA Negative    Spec Grav, UA 1.010 1.010 - 1.025   Blood, UA Negative    pH, UA 6.0 5.0 - 8.0   Protein, UA Negative Negative   Urobilinogen, UA 0.2 0.2 or 1.0 E.U./dL   Nitrite, UA Negative    Leukocytes, UA Negative Negative   Appearance     Odor      Reviewed labs with patient. He states that he was not fasting for lab draw. Elevated Triglycerides likely due to non fasting state.       Assessment & Plan:  1. May start oral fish oil supplements as discussed, recommend fasting labs next year   2. Rash- potentially fungal secondary to police vest- will refer to dermatology due to chronic nature of rash and failed first line interventions. Dermatology may scrape for microscopic exam.  3. Will refer to Neurology for chronic ptosis evaluation at patient's request.  RTC in one year for next physical- earlier with any new concerns

## 2021-03-25 DIAGNOSIS — B360 Pityriasis versicolor: Secondary | ICD-10-CM | POA: Diagnosis not present

## 2021-04-29 ENCOUNTER — Other Ambulatory Visit: Payer: Self-pay | Admitting: Student

## 2021-04-29 DIAGNOSIS — H02402 Unspecified ptosis of left eyelid: Secondary | ICD-10-CM | POA: Diagnosis not present

## 2021-04-29 DIAGNOSIS — E559 Vitamin D deficiency, unspecified: Secondary | ICD-10-CM | POA: Diagnosis not present

## 2021-04-29 DIAGNOSIS — G7000 Myasthenia gravis without (acute) exacerbation: Secondary | ICD-10-CM

## 2021-04-29 DIAGNOSIS — E538 Deficiency of other specified B group vitamins: Secondary | ICD-10-CM | POA: Diagnosis not present

## 2021-05-10 ENCOUNTER — Other Ambulatory Visit: Payer: Self-pay

## 2021-05-10 ENCOUNTER — Ambulatory Visit
Admission: RE | Admit: 2021-05-10 | Discharge: 2021-05-10 | Disposition: A | Discharge status: Home / Self Care | Payer: 59 | Source: Ambulatory Visit | Attending: Student | Admitting: Student

## 2021-05-10 DIAGNOSIS — J019 Acute sinusitis, unspecified: Secondary | ICD-10-CM | POA: Diagnosis not present

## 2021-05-10 DIAGNOSIS — G7000 Myasthenia gravis without (acute) exacerbation: Secondary | ICD-10-CM | POA: Diagnosis not present

## 2021-05-10 MED ORDER — GADOBUTROL 1 MMOL/ML IV SOLN
8.0000 mL | Freq: Once | INTRAVENOUS | Status: AC | PRN
  Administered 2021-05-10: 8 mL via INTRAVENOUS

## 2021-05-13 DIAGNOSIS — R69 Illness, unspecified: Secondary | ICD-10-CM | POA: Diagnosis not present

## 2021-06-30 DIAGNOSIS — H02402 Unspecified ptosis of left eyelid: Secondary | ICD-10-CM | POA: Diagnosis not present

## 2021-06-30 DIAGNOSIS — R69 Illness, unspecified: Secondary | ICD-10-CM | POA: Diagnosis not present

## 2021-11-08 ENCOUNTER — Encounter: Payer: Self-pay | Admitting: Physician Assistant

## 2021-11-08 ENCOUNTER — Other Ambulatory Visit: Payer: Self-pay

## 2021-11-08 ENCOUNTER — Ambulatory Visit: Payer: Self-pay | Admitting: Physician Assistant

## 2021-11-08 VITALS — BP 132/81 | HR 55 | Temp 98.2°F | Resp 14 | Ht 68.0 in | Wt 180.0 lb

## 2021-11-08 DIAGNOSIS — I10 Essential (primary) hypertension: Secondary | ICD-10-CM

## 2021-11-08 NOTE — Progress Notes (Signed)
Pt wants does not want to be dependable on BP medication and wants to know can he start winging off of it?

## 2021-11-08 NOTE — Progress Notes (Signed)
° °  Subjective: Hypertension    Patient ID: Michael Garrison, male    DOB: 02-04-1976, 46 y.o.   MRN: [id number]  HPI Patient here for evaluation of hypertension.  Patient was diagnosed and started treatment last year with Norvasc 5 mg.  Blood pressure has been well controlled.  Patient wishes to go through a trial of not taking medication.  Patient was started on Norvasc 5 mg in 2021 secondary to an ER visit for chest pain.  Cardiac work-up was unremarkable, however cardiologist recommended Amlodipine 5 mg.  Patient denies exertional chest pain.  Patient denies shortness of breath.  Review of Systems Hypertension    Objective:   Physical Exam No acute distress.  Temperature 98.2, pulse 55, respiration 14, BP is 132/81, patient 97% O2 sat on room air.  Patient will 108 pounds and BMI is 27.37. HEENT is unremarkable.  Neck is supple without lymphadenopathy or bruits.  Lungs clear to auscultation.  Heart regular rate and rhythm.       Assessment & Plan:   Advised patient we will monitor his blood pressure 3 days/month for the next 3 months without the medication.  Patient understands that at any time if the blood pressure becomes elevated he will restart Norvasc.

## 2022-02-11 ENCOUNTER — Ambulatory Visit: Payer: Self-pay

## 2022-02-11 DIAGNOSIS — Z0000 Encounter for general adult medical examination without abnormal findings: Secondary | ICD-10-CM

## 2022-02-11 LAB — POCT URINALYSIS DIPSTICK
Bilirubin, UA: NEGATIVE
Blood, UA: NEGATIVE
Glucose, UA: NEGATIVE
Ketones, UA: NEGATIVE
Leukocytes, UA: NEGATIVE
Nitrite, UA: NEGATIVE
Protein, UA: NEGATIVE
Spec Grav, UA: 1.015 (ref 1.010–1.025)
Urobilinogen, UA: 0.2 E.U./dL
pH, UA: 6 (ref 5.0–8.0)

## 2022-02-11 NOTE — Progress Notes (Signed)
Pt presents today for physical labs, will return to clinic for scheduled physical.  

## 2022-02-12 LAB — CMP12+LP+TP+TSH+6AC+CBC/D/PLT
ALT: 45 IU/L — ABNORMAL HIGH (ref 0–44)
AST: 33 IU/L (ref 0–40)
Albumin/Globulin Ratio: 1.8 (ref 1.2–2.2)
Albumin: 4.9 g/dL (ref 4.0–5.0)
Alkaline Phosphatase: 75 IU/L (ref 44–121)
BUN/Creatinine Ratio: 13 (ref 9–20)
BUN: 12 mg/dL (ref 6–24)
Basophils Absolute: 0 10*3/uL (ref 0.0–0.2)
Basos: 0 %
Bilirubin Total: 0.6 mg/dL (ref 0.0–1.2)
Calcium: 9.8 mg/dL (ref 8.7–10.2)
Chloride: 103 mmol/L (ref 96–106)
Chol/HDL Ratio: 3 ratio (ref 0.0–5.0)
Cholesterol, Total: 148 mg/dL (ref 100–199)
Creatinine, Ser: 0.9 mg/dL (ref 0.76–1.27)
EOS (ABSOLUTE): 0.1 10*3/uL (ref 0.0–0.4)
Eos: 1 %
Estimated CHD Risk: <0.5 times avg. (ref 0.0–1.0)
Free Thyroxine Index: 2.8 (ref 1.2–4.9)
GGT: 27 IU/L (ref 0–65)
Globulin, Total: 2.7 g/dL (ref 1.5–4.5)
Glucose: 116 mg/dL — ABNORMAL HIGH (ref 70–99)
HDL: 50 mg/dL (ref >39)
Hematocrit: 45.9 % (ref 37.5–51.0)
Hemoglobin: 16.2 g/dL (ref 13.0–17.7)
Immature Grans (Abs): 0 10*3/uL (ref 0.0–0.1)
Immature Granulocytes: 0 %
Iron: 124 ug/dL (ref 38–169)
LDH: 214 IU/L (ref 121–224)
LDL Chol Calc (NIH): 77 mg/dL (ref 0–99)
Lymphocytes Absolute: 2 10*3/uL (ref 0.7–3.1)
Lymphs: 28 %
MCH: 32.7 pg (ref 26.6–33.0)
MCHC: 35.3 g/dL (ref 31.5–35.7)
MCV: 93 fL (ref 79–97)
Monocytes Absolute: 0.5 10*3/uL (ref 0.1–0.9)
Monocytes: 7 %
Neutrophils Absolute: 4.4 10*3/uL (ref 1.4–7.0)
Neutrophils: 64 %
Phosphorus: 3.5 mg/dL (ref 2.8–4.1)
Platelets: 203 10*3/uL (ref 150–450)
Potassium: 4.8 mmol/L (ref 3.5–5.2)
RBC: 4.95 x10E6/uL (ref 4.14–5.80)
RDW: 12.3 % (ref 11.6–15.4)
Sodium: 144 mmol/L (ref 134–144)
T3 Uptake Ratio: 33 % (ref 24–39)
T4, Total: 8.5 ug/dL (ref 4.5–12.0)
TSH: 1.36 u[IU]/mL (ref 0.450–4.500)
Total Protein: 7.6 g/dL (ref 6.0–8.5)
Triglycerides: 119 mg/dL (ref 0–149)
Uric Acid: 7.4 mg/dL (ref 3.8–8.4)
VLDL Cholesterol Cal: 21 mg/dL (ref 5–40)
WBC: 7 10*3/uL (ref 3.4–10.8)
eGFR: 106 mL/min/{1.73_m2} (ref >59)

## 2022-02-15 ENCOUNTER — Encounter: Payer: Self-pay | Admitting: Physician Assistant

## 2022-02-15 ENCOUNTER — Ambulatory Visit: Payer: Self-pay | Admitting: Physician Assistant

## 2022-02-15 VITALS — BP 145/95 | HR 62 | Temp 97.8°F | Resp 14 | Ht 68.0 in | Wt 180.0 lb

## 2022-02-15 DIAGNOSIS — Z0000 Encounter for general adult medical examination without abnormal findings: Secondary | ICD-10-CM

## 2022-02-15 DIAGNOSIS — I10 Essential (primary) hypertension: Secondary | ICD-10-CM

## 2022-02-15 NOTE — Progress Notes (Signed)
?[facility] ? ?____________________________________________ ? ? None  ?  (approximate) ? ?I have reviewed the triage vital signs and the nursing notes. ? ? ?HISTORY ? ?Chief Complaint ?Annual Exam ? ? ?HPI ?Michael Garrison is a 46 y.o. male patient presents for annual physical exam.  Patient was concerned due to fasting glucose of 116.  Patient also mentioned that he stopped taking Norvasc at 5 mg 3 months ago due to fear of being dependent on the medication.  Denies chest pain or shortness of breath. ?   ? ?  ? ?History reviewed. No pertinent past medical history. ? ?Patient Active Problem List  ? Diagnosis Date Noted  ? Chest pain at rest 10/07/2020  ? Essential hypertension 10/07/2020  ? ? ?Past Surgical History:  ?Procedure Laterality Date  ? VASECTOMY    ? ? ?Prior to Admission medications   ?Medication Sig Start Date End Date Taking? Authorizing Provider  ?amLODipine (NORVASC) 5 MG tablet Take 1 tablet by mouth daily. ?Patient not taking: Reported on 02/15/2022 12/29/20   [provider]  ? ? ?Allergies ?Patient has no known allergies. ? ?History reviewed. No pertinent family history. ? ?Social History ?Social History  ? ?Tobacco Use  ? Smoking status: Never  ? Smokeless tobacco: Never  ?Vaping Use  ? Vaping Use: Never used  ?Substance Use Topics  ? Alcohol use: Yes  ?  Alcohol/week: 2.0 standard drinks  ?  Types: 2 Cans of beer per week  ?  Comment: Jul 2020  ? Drug use: Never  ? ? ?Review of Systems ? ?Constitutional: No fever/chills ?Eyes: No visual changes. ?ENT: No sore throat. ?Cardiovascular: Denies chest pain. ?Respiratory: Denies shortness of breath. ?Gastrointestinal: No abdominal pain.  No nausea, no vomiting.  No diarrhea.  No constipation. ?Genitourinary: Negative for dysuria. ?Musculoskeletal: Negative for back pain. ?Skin: Negative for rash. ?Neurological: Negative for headaches, focal weakness or numbness. ?Endocrine:  Hypertension ? ? ?____________________________________________ ? ? ?PHYSICAL EXAM: ? ?VITAL SIGNS: Temperature 97.8, respiration 14, pulse 62, BP is 145/95, patient 94% O2 sat on room air.  Patient weighs 180 pounds and BMI is 27.37. ?Constitutional: Alert and oriented. Well appearing and in no acute distress. ?Eyes: Conjunctivae are normal. PERRL. EOMI. ?Head: Atraumatic. ?Nose: No congestion/rhinnorhea. ?Mouth/Throat: Mucous membranes are moist.  Oropharynx non-erythematous. ?Neck: No stridor.  No cervical spine tenderness to palpation. ?Hematological/Lymphatic/Immunilogical: No cervical lymphadenopathy. ?Cardiovascular: Normal rate, regular rhythm. Grossly normal heart sounds.  Good peripheral circulation. ?Respiratory: Normal respiratory effort.  No retractions. Lungs CTAB. ?Gastrointestinal: Soft and nontender. No distention. No abdominal bruits. No CVA tenderness. ?Genitourinary: Deferred ?Musculoskeletal: No lower extremity tenderness nor edema.  No joint effusions. ?Neurologic:  Normal speech and language. No gross focal neurologic deficits are appreciated. No gait instability. ?Skin:  Skin is warm, dry and intact. No rash noted. ?Psychiatric: Mood and affect are normal. Speech and behavior are normal. ? ?____________________________________________ ?  ?LAB ?_ ?      ?Component Ref Range & Units 4 d ago 11 mo ago 1 yr ago  ?Color, UA  yellow  Light Yellow  light yellow   ?Clarity, UA  clear  Clear  clear   ?Glucose, UA Negative Negative  Negative  Negative   ?Bilirubin, UA  negative  Negative  negative   ?Ketones, UA  negative  Negative  negative   ?Spec Grav, UA 1.010 - 1.025 1.015  1.010  1.015   ?Blood, UA  negative  Negative  negative   ?pH,  UA 5.0 - 8.0 6.0  6.0  6.0   ?Protein, UA Negative Negative  Negative  Negative   ?Urobilinogen, UA 0.2 or 1.0 E.U./dL 0.2  0.2  0.2   ?Nitrite, UA  negative  Negative  negative   ?Leukocytes, UA Negative Negative  Negative  Negative   ?Appearance        ?Odor         ?  ? ?  ?  ?Specimen Collected: 02/11/22 09:50 Last Resulted: 02/11/22 09:50  ?  ?  ?View Encounter Conversation    ?  ?  ? ?  ?  ? ?Other Results from 02/11/2022 ? ? Contains abnormal data CMP12+LP+TP+TSH+6AC+CBC/D/Plt ?Order: [id number] ?Status: Final result    ?Visible to patient: Yes (seen)    ?Next appt: None    ?Dx: Routine adult health maintenance    ?0 Result Notes ?        ?Component Ref Range & Units 4 d ago ?(02/11/22) 11 mo ago ?(03/08/21) 1 yr ago ?(10/01/20) 1 yr ago ?(10/01/20) 1 yr ago ?(04/10/20)  ?Glucose 70 - 99 mg/dL 116 High   85 R  100 High  CM   73 R   ?Uric Acid 3.8 - 8.4 mg/dL 7.4  6.8 CM    7.1 CM   ?Comment:            Therapeutic target for gout patients: <6.0  ?BUN 6 - 24 mg/dL 12  12  14  R   16   ?Creatinine, Ser 0.76 - 1.27 mg/dL 0.90  0.87  0.88 R   0.87   ?eGFR >59 mL/min/1.73 106  108      ?BUN/Creatinine Ratio 9 - 20 13  14    18    ?Sodium 134 - 144 mmol/L 144  140  138 R   140   ?Potassium 3.5 - 5.2 mmol/L 4.8  4.0  4.0 R   4.6   ?Chloride 96 - 106 mmol/L 103  102  101 R   103   ?Calcium 8.7 - 10.2 mg/dL 9.8  9.7  9.3 R   9.2   ?Phosphorus 2.8 - 4.1 mg/dL 3.5  3.2    3.3   ?Total Protein 6.0 - 8.5 g/dL 7.6  7.2    7.0   ?Albumin 4.0 - 5.0 g/dL 4.9  4.8    4.6   ?Globulin, Total 1.5 - 4.5 g/dL 2.7  2.4    2.4   ?Albumin/Globulin Ratio 1.2 - 2.2 1.8  2.0    1.9   ?Bilirubin Total 0.0 - 1.2 mg/dL 0.6  0.6    0.6   ?Alkaline Phosphatase 44 - 121 IU/L 75  86    70 R   ?LDH 121 - 224 IU/L 214  198    211   ?AST 0 - 40 IU/L 33  29    24   ?ALT 0 - 44 IU/L 45 High   40    32   ?GGT 0 - 65 IU/L 27  31    23    ?Iron 38 - 169 ug/dL 124  110    121   ?Cholesterol, Total 100 - 199 mg/dL 148  147    135   ?Triglycerides 0 - 149 mg/dL 119  182 High     120   ?HDL >39 mg/dL 50  40    40   ?VLDL Cholesterol Cal 5 - 40 mg/dL 21  31    22    ?LDL Chol  Calc (NIH) 0 - 99 mg/dL 77  76    73   ?Chol/HDL Ratio 0.0 - 5.0 ratio 3.0  3.7 CM    3.4 CM   ?Comment:                                   T. Chol/HDL Ratio   ?                                            Men  Women  ?                              1/2 Avg.Risk  3.4    3.3  ?                                  Avg.Risk  5.0    4.4  ?                               2X Avg.Risk  9.6    7.1  ?                               3X Avg.Risk 23.4   11.0   ?Estimated CHD Risk 0.0 - 1.0 times avg.  < 0.5  0.6 CM    0.5 CM   ?Comment: The CHD Risk is based on the T. Chol/HDL ratio. Other  ?factors affect CHD Risk such as hypertension, smoking,  ?diabetes, severe obesity, and family history of  ?premature CHD.   ?TSH 0.450 - 4.500 uIU/mL 1.360  1.070    0.687   ?T4, Total 4.5 - 12.0 ug/dL 8.5  7.1    6.8   ?T3 Uptake Ratio 24 - 39 % 33  26    27   ?Free Thyroxine Index 1.2 - 4.9 2.8  1.8    1.8   ?WBC 3.4 - 10.8 x10E3/uL 7.0  7.1   7.5 R  6.7   ?RBC 4.14 - 5.80 x10E6/uL 4.95  4.92   4.80 R  4.60   ?Hemoglobin 13.0 - 17.7 g/dL 16.2  15.7   15.5 R  14.8   ?Hematocrit 37.5 - 51.0 % 45.9  44.5   43.7 R  42.7   ?MCV 79 - 97 fL 93  90   91.0 R  93   ?MCH 26.6 - 33.0 pg 32.7  31.9   32.3 R  32.2   ?MCHC 31.5 - 35.7 g/dL 35.3  35.3   35.5 R  34.7   ?RDW 11.6 - 15.4 % 12.3  12.6   13.6 R  12.2   ?Platelets 150 - 450 x10E3/uL 203  202   195 R  175   ?Neutrophils Not Estab. % 64  58    56   ?Lymphs Not Estab. % 28  31    35   ?Monocytes Not Estab. % 7  8    7    ?Eos Not Estab. % 1  2    2    ?Basos Not Estab. % 0  1  0   ?Neutrophils Absolute 1.4 - 7.0 x10E3/uL 4.4  4.1    3.7   ?Lymphocytes Absolute 0.7 - 3.1 x10E3/uL 2.0  2.2    2.3   ?Monocytes Absolute 0.1 - 0.9 x10E3/uL 0.5  0.6    0.5   ?EOS (ABSOLUTE) 0.0 - 0.4 x10E3/uL 0.1  0.2    0.1   ?Basophils Absolute 0.0 - 0.2 x10E3/uL 0.0  0.0    0.0   ?Immature Granulocytes Not Estab. % 0  0    0   ?Immature Grans         ?  ?_______________________________ ? ?EKG ?Sinus  Rhythm at 64 bpm ?WITHIN NORMAL LIMITS ? ? ?____________________________________________ ? ? ? ?____________________________________________ ? ? ?Procedures ?Hemoglobin A1c  fingerstick ? ?____________________________________________ ? ? ?INITIAL IMPRESSION / ASSESSMENT AND PLAN ?As part of my medical decision making, I reviewed the following data within the [facility]  ? ?   ?Discussed lab results

## 2022-02-15 NOTE — Progress Notes (Signed)
Pt denies any issues or concerns at this time/CL,RMA ?

## 2022-05-16 IMAGING — MR MR HEAD WO/W CM
14 series · 48 of 48 positions shown · IV contrast (gadavist)
Comparison: No pertinent prior exams available for comparison.

CLINICAL DATA: Provided history: Ocular myasthenia gravis.
Additional history provided by scanning technologist: Patient
reports left eyelid weakness/droop for years, progressing.

EXAM:
MRI HEAD WITHOUT AND WITH CONTRAST
TECHNIQUE: Multiplanar, multiecho pulse sequences of the brain and surrounding
structures were obtained without and with intravenous contrast.
CONTRAST:  8mL GADAVIST GADOBUTROL 1 MMOL/ML IV SOLN

[Series 5: ax dwi_tracew · axial · 3.0mm · 0.65mm/px · z∈[-79,+73]mm · 4 of 48 slices shown]
[im 1/48]
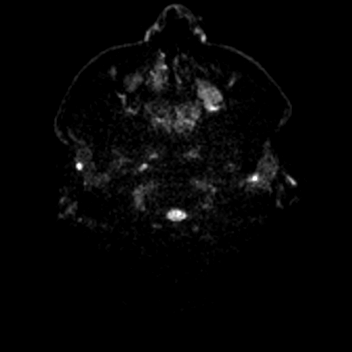
[im 16/48]
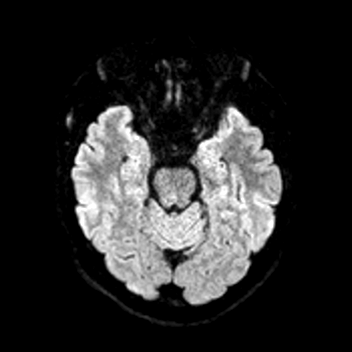
[im 32/48]
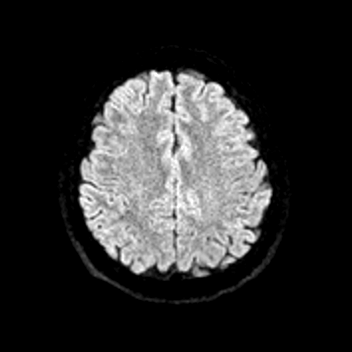
[im 48/48]
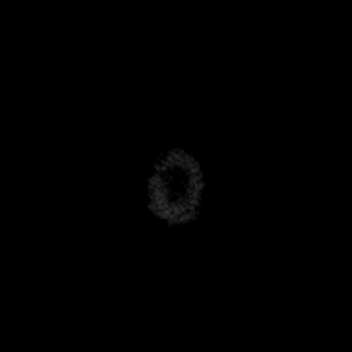

[Series 6: ax dwi_adc · axial · 3.0mm · 0.65mm/px · z∈[-79,+73]mm · 3 of 48 slices shown]
[im 1/48]
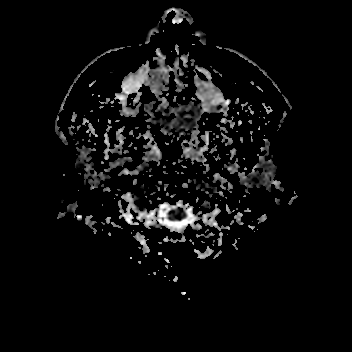
[im 24/48]
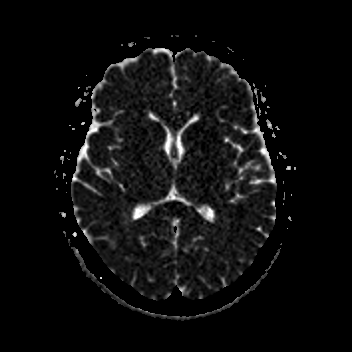
[im 48/48]
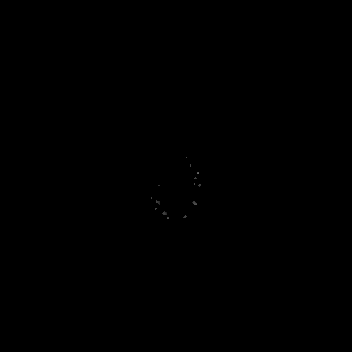

[Series 7: cor dwi_tracew · coronal · 5.0mm · 0.65mm/px · 2 of 40 slices shown]
[im 1/40]
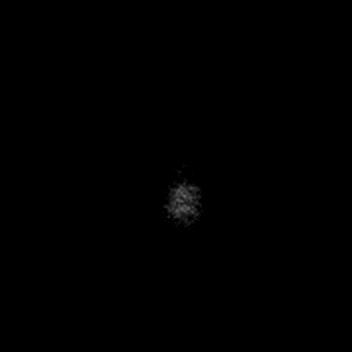
[im 40/40]
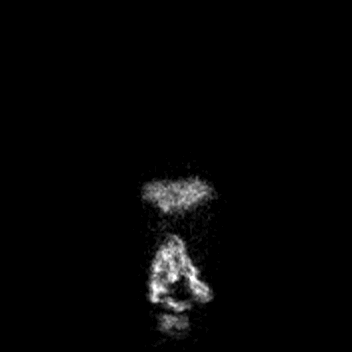

[Series 8: cor dwi_adc · coronal · 5.0mm · 0.65mm/px · 2 of 39 slices shown]
[im 1/39]
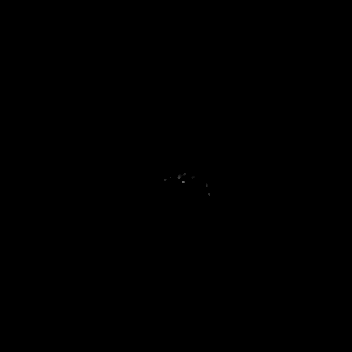
[im 39/39]
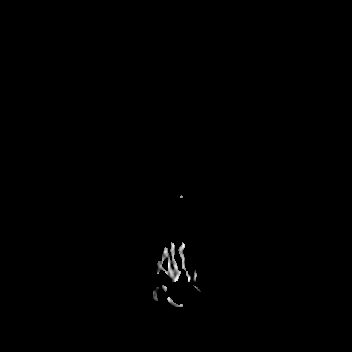

[Series 9: T1 · sagittal · 5.0mm · 0.62mm/px · 1 of 23 slices shown (1 of 2)]
[im 1/23]
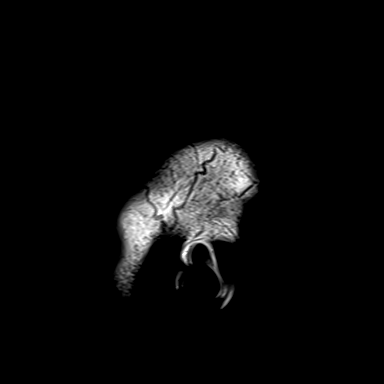

[Series 10: T2 · axial · 5.0mm · 0.53mm/px · z∈[-80,+73]mm · 2 of 27 slices shown]
[im 1/27]
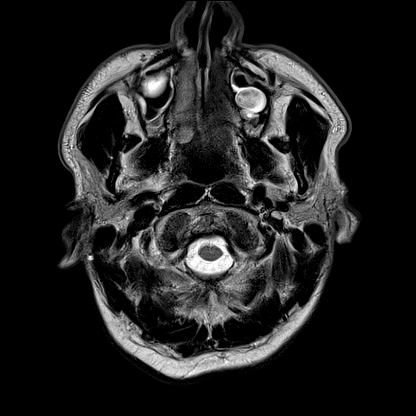
[im 27/27]
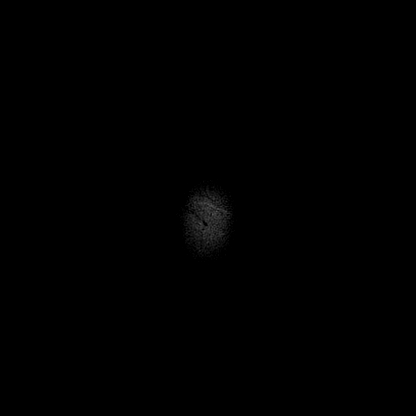

[Series 11: mag_images · axial · 3.0mm · 0.90mm/px · z∈[-90,+85]mm · 3 of 60 slices shown]
[im 1/60]
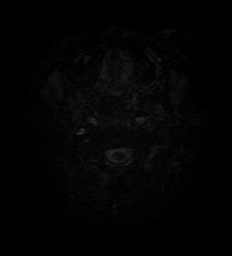
[im 30/60]
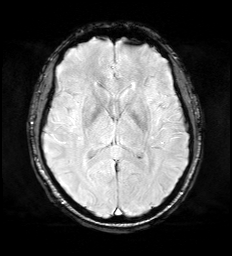
[im 60/60]
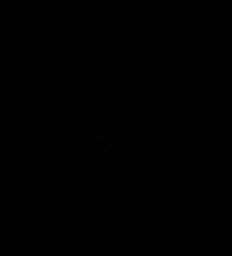

[Series 12: pha_images · axial · 3.0mm · 0.90mm/px · z∈[-90,+79]mm · 3 of 57 slices shown]
[im 1/57]
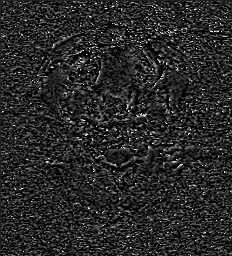
[im 29/57]
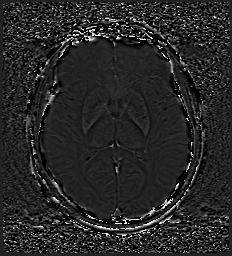
[im 57/57]
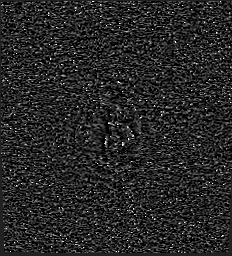

[Series 13: swi_images · axial · 3.0mm · 0.90mm/px · z∈[-90,+85]mm · 3 of 60 slices shown]
[im 1/60]
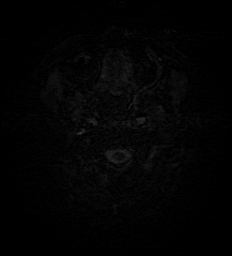
[im 30/60]
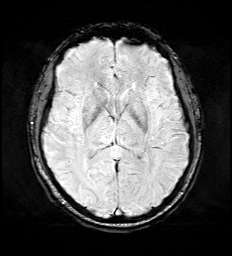
[im 60/60]
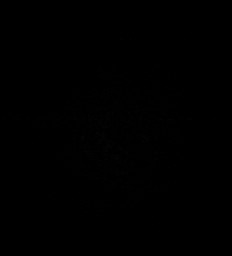

[Series 15: FLAIR · axial · 3.0mm · 0.53mm/px · z∈[-83,+76]mm · 3 of 55 slices shown]
[im 1/55]
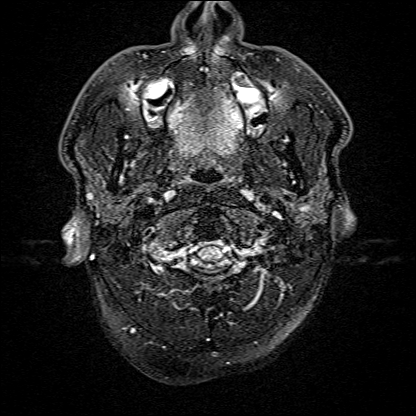
[im 28/55]
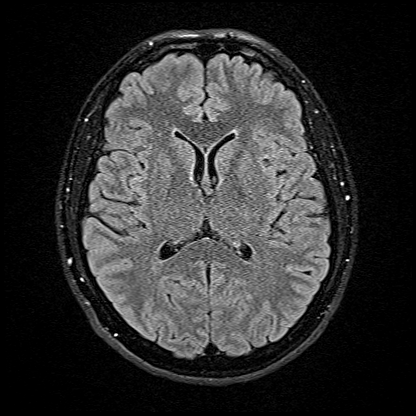
[im 55/55]
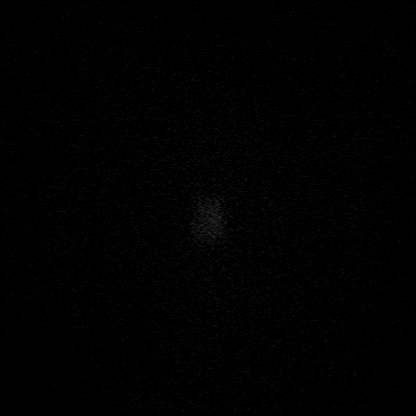

[Series 16: T1 · axial · 1.0mm · 0.98mm/px · z∈[-80,+77]mm · 9 of 160 slices shown (2 of 2)]
[im 1/160]
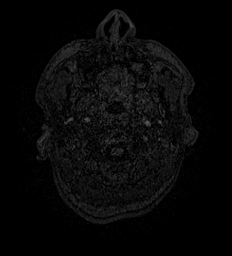
[im 20/160]
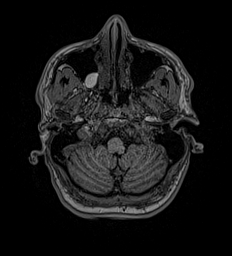
[im 40/160]
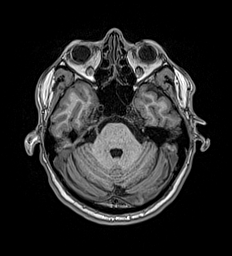
[im 60/160]
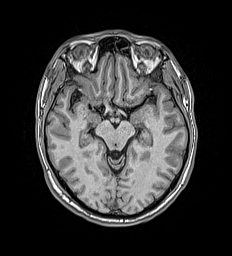
[im 80/160]
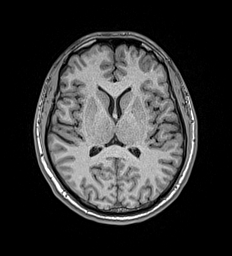
[im 100/160]
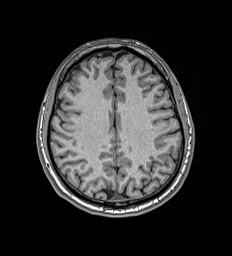
[im 120/160]
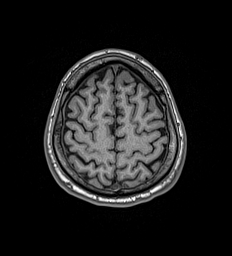
[im 140/160]
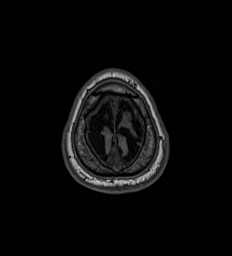
[im 160/160]
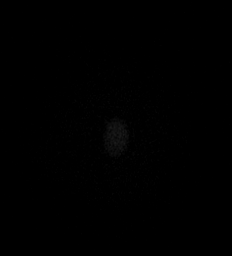

[Series 17: T2 post-contrast · coronal · 5.0mm · 0.57mm/px · 2 of 29 slices shown]
[im 1/29]
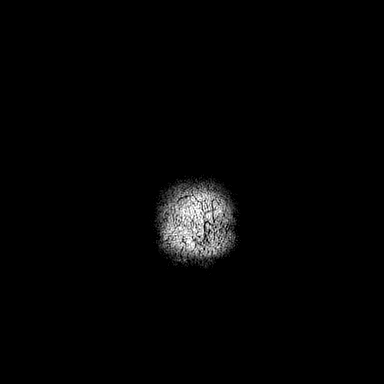
[im 29/29]
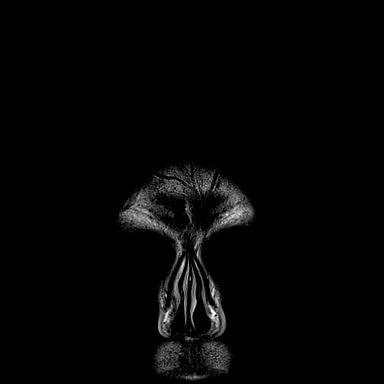

[Series 18: T1 post-contrast · axial · 1.0mm · 0.98mm/px · z∈[-80,+77]mm · 9 of 160 slices shown (1 of 2)]
[im 1/160]
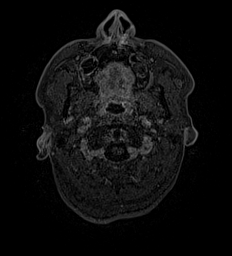
[im 20/160]
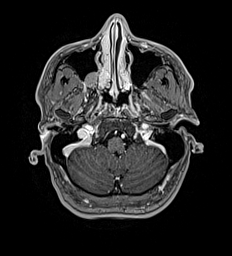
[im 40/160]
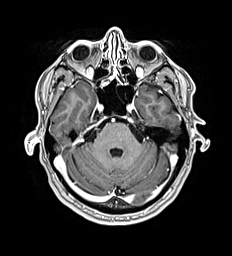
[im 60/160]
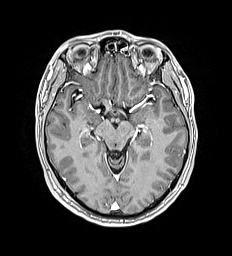
[im 80/160]
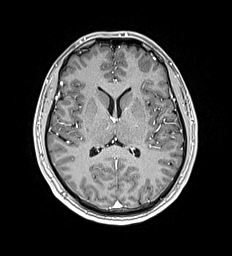
[im 100/160]
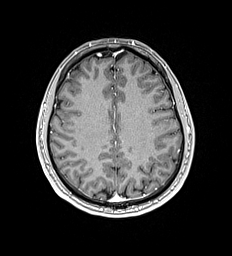
[im 120/160]
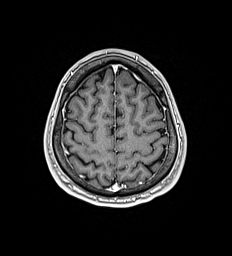
[im 140/160]
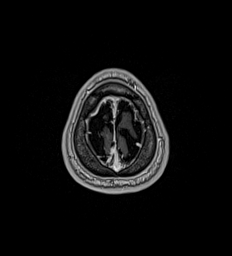
[im 160/160]
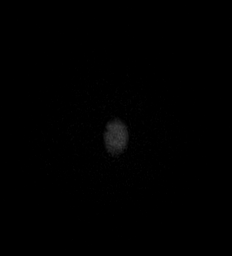

[Series 19: T1 post-contrast · coronal · 5.0mm · 0.57mm/px · 2 of 29 slices shown (2 of 2)]
[im 1/29]
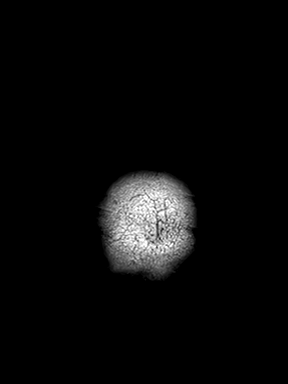
[im 29/29]
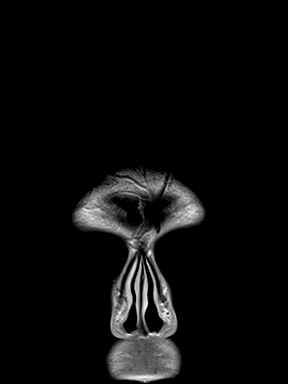

[48 of 48 positions shown; findings below may reference images not displayed]

FINDINGS: Brain:

Cerebral volume is normal.

No cortical encephalomalacia is identified. No significant white
matter disease for age.

There is no acute infarct.

No evidence of an intracranial mass.

No chronic intracranial blood products.

No extra-axial fluid collection.

No midline shift.

No abnormal intracranial enhancement.

Vascular: Expected proximal arterial flow voids.

Skull and upper cervical spine: No focal marrow lesion.

Sinuses/Orbits: Visualized orbits show no acute finding. Small to
moderate-sized mucous retention cysts and background trace mucosal
thickening within the bilateral maxillary sinuses. Trace bilateral
ethmoid sinus mucosal thickening.
IMPRESSION: Unremarkable MRI appearance of the brain for age. No evidence of
acute intracranial abnormality.

Paranasal sinus disease, as described.

## 2022-05-30 ENCOUNTER — Other Ambulatory Visit: Payer: Self-pay

## 2022-05-30 DIAGNOSIS — I10 Essential (primary) hypertension: Secondary | ICD-10-CM

## 2022-05-31 MED ORDER — AMLODIPINE BESYLATE 5 MG PO TABS: 5.0000 mg | ORAL_TABLET | Freq: Every day | ORAL | 3 refills | Status: DC

## 2022-07-18 ENCOUNTER — Other Ambulatory Visit: Payer: Self-pay

## 2022-07-18 NOTE — Progress Notes (Signed)
Pt completed random UDS &ETOH. Pending results for UDS with [facility]. ETOH is cleared.Michael Garrison

## 2023-04-25 ENCOUNTER — Ambulatory Visit: Payer: Self-pay

## 2023-04-25 DIAGNOSIS — Z0000 Encounter for general adult medical examination without abnormal findings: Secondary | ICD-10-CM

## 2023-04-25 LAB — POCT URINALYSIS DIPSTICK
Bilirubin, UA: NEGATIVE
Blood, UA: NEGATIVE
Glucose, UA: NEGATIVE
Ketones, UA: NEGATIVE
Leukocytes, UA: NEGATIVE
Nitrite, UA: NEGATIVE
Protein, UA: NEGATIVE
Spec Grav, UA: 1.01 (ref 1.010–1.025)
Urobilinogen, UA: 0.2 E.U./dL
pH, UA: 6 (ref 5.0–8.0)

## 2023-04-25 NOTE — Progress Notes (Signed)
Fasting labs, urine and EKG completed.

## 2023-04-26 LAB — CMP12+LP+TP+TSH+6AC+PSA+CBC…
ALT: 30 IU/L (ref 0–44)
AST: 24 IU/L (ref 0–40)
Albumin: 4.6 g/dL (ref 4.1–5.1)
Alkaline Phosphatase: 84 IU/L (ref 44–121)
BUN/Creatinine Ratio: 15 (ref 9–20)
BUN: 12 mg/dL (ref 6–24)
Basophils Absolute: 0 10*3/uL (ref 0.0–0.2)
Basos: 0 %
Bilirubin Total: 0.6 mg/dL (ref 0.0–1.2)
Calcium: 9.6 mg/dL (ref 8.7–10.2)
Chloride: 103 mmol/L (ref 96–106)
Chol/HDL Ratio: 2.7 ratio (ref 0.0–5.0)
Cholesterol, Total: 132 mg/dL (ref 100–199)
Creatinine, Ser: 0.82 mg/dL (ref 0.76–1.27)
EOS (ABSOLUTE): 0.1 10*3/uL (ref 0.0–0.4)
Eos: 2 %
Estimated CHD Risk: <0.5 times avg. (ref 0.0–1.0)
Free Thyroxine Index: 1.8 (ref 1.2–4.9)
GGT: 22 IU/L (ref 0–65)
Globulin, Total: 2.4 g/dL (ref 1.5–4.5)
Glucose: 93 mg/dL (ref 70–99)
HDL: 49 mg/dL (ref >39)
Hematocrit: 43.5 % (ref 37.5–51.0)
Hemoglobin: 14.9 g/dL (ref 13.0–17.7)
Immature Grans (Abs): 0 10*3/uL (ref 0.0–0.1)
Immature Granulocytes: 0 %
Iron: 116 ug/dL (ref 38–169)
LDH: 186 IU/L (ref 121–224)
LDL Chol Calc (NIH): 65 mg/dL (ref 0–99)
Lymphocytes Absolute: 2.5 10*3/uL (ref 0.7–3.1)
Lymphs: 38 %
MCH: 31.3 pg (ref 26.6–33.0)
MCHC: 34.3 g/dL (ref 31.5–35.7)
MCV: 91 fL (ref 79–97)
Monocytes Absolute: 0.5 10*3/uL (ref 0.1–0.9)
Monocytes: 8 %
Neutrophils Absolute: 3.4 10*3/uL (ref 1.4–7.0)
Neutrophils: 52 %
Phosphorus: 3.2 mg/dL (ref 2.8–4.1)
Platelets: 201 10*3/uL (ref 150–450)
Potassium: 4.3 mmol/L (ref 3.5–5.2)
Prostate Specific Ag, Serum: 0.3 ng/mL (ref 0.0–4.0)
RBC: 4.76 x10E6/uL (ref 4.14–5.80)
RDW: 12 % (ref 11.6–15.4)
Sodium: 138 mmol/L (ref 134–144)
T3 Uptake Ratio: 28 % (ref 24–39)
T4, Total: 6.6 ug/dL (ref 4.5–12.0)
TSH: 1.03 u[IU]/mL (ref 0.450–4.500)
Total Protein: 7 g/dL (ref 6.0–8.5)
Triglycerides: 96 mg/dL (ref 0–149)
Uric Acid: 7.2 mg/dL (ref 3.8–8.4)
VLDL Cholesterol Cal: 18 mg/dL (ref 5–40)
WBC: 6.5 10*3/uL (ref 3.4–10.8)
eGFR: 108 mL/min/{1.73_m2} (ref >59)

## 2023-04-27 ENCOUNTER — Ambulatory Visit: Payer: Self-pay | Admitting: Adult Health

## 2023-04-27 ENCOUNTER — Encounter: Payer: Self-pay | Admitting: Adult Health

## 2023-04-27 VITALS — BP 130/89 | HR 62 | Temp 98.6°F | Resp 14 | Ht 68.0 in | Wt 182.0 lb

## 2023-04-27 DIAGNOSIS — Z0000 Encounter for general adult medical examination without abnormal findings: Secondary | ICD-10-CM

## 2023-04-27 DIAGNOSIS — I10 Essential (primary) hypertension: Secondary | ICD-10-CM

## 2023-04-27 DIAGNOSIS — B354 Tinea corporis: Secondary | ICD-10-CM

## 2023-04-27 MED ORDER — FLUCONAZOLE 150 MG PO TABS: ORAL_TABLET | ORAL | 0 refills | Status: DC

## 2023-04-27 NOTE — Progress Notes (Signed)
[facility] [address] [place], [place] [address]  Internal MEDICINE  Office Visit Note  Patient Name: Michael Garrison  [id number]  [id number]  Date of Service: 04/27/2023  Chief Complaint  Patient presents with   Annual Exam     HPI Pt is here for routine health maintenance examination.  He is a well-appearing 47 year old man.  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  He is currently married to his wife of 19 years and has a 69-year-old daughter and a 56-year-old son.  His medical history is remarkable only for hypertension he is currently on amlodipine with good control of this.  He denies any nicotine use he occasionally drinks alcohol.  He also denies any illicit drug use.  His only concern today is a rash that he had in the past.  He reports full body rash around his neck and down his back that a dermatologist gave him a single pill for that solved it.  Current Medication: Outpatient Encounter Medications as of 04/27/2023  Medication Sig   fluconazole (DIFLUCAN) 150 MG tablet Take one tablet by mouth today, and then repeat every 5 days for a total of 3 doses.   amLODipine (NORVASC) 5 MG tablet Take 1 tablet (5 mg total) by mouth daily.   No facility-administered encounter medications on file as of 04/27/2023.    Surgical History: Past Surgical History:  Procedure Laterality Date   VASECTOMY      Medical History: Past Medical History:  Diagnosis Date   HTN (hypertension)     Family History: History reviewed. No pertinent family history.  Social History: Social History   Socioeconomic History   Marital status: Married    Spouse name: Not on file   Number of children: Not on file   Years of education: Not on file   Highest education level: Not on file  Occupational History   Not on file  Tobacco Use   Smoking status: Never   Smokeless tobacco: Never  Vaping Use   Vaping Use: Never used  Substance and Sexual Activity    Alcohol use: Yes    Alcohol/week: 2.0 standard drinks of alcohol    Types: 2 Cans of beer per week    Comment: Jul 2020   Drug use: Never   Sexual activity: Yes    Partners: Female  Other Topics Concern   Not on file  Social History Narrative   Not on file   Social Determinants of Health   Financial Resource Strain: Not on file  Food Insecurity: Not on file  Transportation Needs: Not on file  Physical Activity: Not on file  Stress: Not on file  Social Connections: Not on file      Review of Systems  Constitutional:  Negative for activity change, appetite change and fatigue.  HENT:  Negative for congestion, sinus pain, trouble swallowing and voice change.   Eyes:  Negative for pain, discharge and visual disturbance.  Respiratory:  Negative for cough, chest tightness and shortness of breath.   Cardiovascular:  Negative for chest pain and leg swelling.  Gastrointestinal:  Negative for abdominal distention, abdominal pain, constipation and diarrhea.  Musculoskeletal:  Negative for arthralgias, back pain and neck pain.  Skin:  Positive for color change and rash.  Neurological:  Negative for dizziness, weakness and headaches.  Hematological:  Negative for adenopathy.  Psychiatric/Behavioral:  Negative for agitation, confusion and suicidal ideas.      Vital Signs: BP 130/89  Pulse 62   Temp 98.6 F (37 C) (Temporal)   Resp 14   Ht 5\' 8"  (1.727 m)   Wt 182 lb (82.6 kg)   SpO2 97%   BMI 27.67 kg/m    Physical Exam Constitutional:      Appearance: Normal appearance.  HENT:     Head: Normocephalic.     Right Ear: Tympanic membrane normal.     Left Ear: Tympanic membrane normal.     Nose: Nose normal.     Mouth/Throat:     Mouth: Mucous membranes are moist.     Pharynx: No oropharyngeal exudate or posterior oropharyngeal erythema.  Eyes:     General:        Right eye: No discharge.        Left eye: No discharge.     Extraocular Movements: Extraocular movements  intact.     Pupils: Pupils are equal, round, and reactive to light.  Cardiovascular:     Rate and Rhythm: Normal rate and regular rhythm.     Pulses: Normal pulses.     Heart sounds: Normal heart sounds. No murmur heard. Pulmonary:     Effort: Pulmonary effort is normal. No respiratory distress.     Breath sounds: Normal breath sounds. No wheezing or rhonchi.  Abdominal:     General: Abdomen is flat. Bowel sounds are normal. There is no distension.     Palpations: There is no mass.     Tenderness: There is no abdominal tenderness. There is no guarding.     Hernia: No hernia is present.  Musculoskeletal:        General: No swelling or deformity. Normal range of motion.     Cervical back: Normal range of motion.  Skin:    General: Skin is warm and dry.     Capillary Refill: Capillary refill takes less than 2 seconds.  Neurological:     General: No focal deficit present.     Mental Status: He is alert.     Cranial Nerves: No cranial nerve deficit.     Gait: Gait normal.  Psychiatric:        Mood and Affect: Mood normal.        Behavior: Behavior normal.        Judgment: Judgment normal.      LABS: Recent Results (from the past 2160 hour(s))  POCT Urinalysis Dipstick (CPT 81002)     Status: None   Collection Time: 04/25/23  9:35 AM  Result Value Ref Range   Color, UA Light Yellow    Clarity, UA Clear    Glucose, UA Negative Negative   Bilirubin, UA Negative    Ketones, UA Negative    Spec Grav, UA 1.010 1.010 - 1.025   Blood, UA Negative    pH, UA 6.0 5.0 - 8.0   Protein, UA Negative Negative   Urobilinogen, UA 0.2 0.2 or 1.0 E.U./dL   Nitrite, UA Negative    Leukocytes, UA Negative Negative   Appearance     Odor    Male Executive Panel     Status: None   Collection Time: 04/25/23  9:37 AM  Result Value Ref Range   Glucose 93 70 - 99 mg/dL   Uric Acid 7.2 3.8 - 8.4 mg/dL    Comment:            Therapeutic target for gout patients: <6.0   BUN 12 6 - 24 mg/dL    Creatinine, Ser 6.43 0.76 -  1.27 mg/dL   eGFR 010 >27 OZ/DGU/4.40   BUN/Creatinine Ratio 15 9 - 20   Sodium 138 134 - 144 mmol/L   Potassium 4.3 3.5 - 5.2 mmol/L   Chloride 103 96 - 106 mmol/L   Calcium 9.6 8.7 - 10.2 mg/dL   Phosphorus 3.2 2.8 - 4.1 mg/dL   Total Protein 7.0 6.0 - 8.5 g/dL   Albumin 4.6 4.1 - 5.1 g/dL   Globulin, Total 2.4 1.5 - 4.5 g/dL   Bilirubin Total 0.6 0.0 - 1.2 mg/dL   Alkaline Phosphatase 84 44 - 121 IU/L   LDH 186 121 - 224 IU/L   AST 24 0 - 40 IU/L   ALT 30 0 - 44 IU/L   GGT 22 0 - 65 IU/L   Iron 116 38 - 169 ug/dL   Cholesterol, Total 347 100 - 199 mg/dL   Triglycerides 96 0 - 149 mg/dL   HDL 49 >42 mg/dL   VLDL Cholesterol Cal 18 5 - 40 mg/dL   LDL Chol Calc (NIH) 65 0 - 99 mg/dL   Chol/HDL Ratio 2.7 0.0 - 5.0 ratio    Comment:                                   T. Chol/HDL Ratio                                             Men  Women                               1/2 Avg.Risk  3.4    3.3                                   Avg.Risk  5.0    4.4                                2X Avg.Risk  9.6    7.1                                3X Avg.Risk 23.4   11.0    Estimated CHD Risk  < 0.5 0.0 - 1.0 times avg.    Comment: The CHD Risk is based on the T. Chol/HDL ratio. Other factors affect CHD Risk such as hypertension, smoking, diabetes, severe obesity, and family history of premature CHD.    TSH 1.030 0.450 - 4.500 uIU/mL   T4, Total 6.6 4.5 - 12.0 ug/dL   T3 Uptake Ratio 28 24 - 39 %   Free Thyroxine Index 1.8 1.2 - 4.9   Prostate Specific Ag, Serum 0.3 0.0 - 4.0 ng/mL    Comment: Roche ECLIA methodology. According to the American Urological Association, Serum PSA should decrease and remain at undetectable levels after radical prostatectomy. The AUA defines biochemical recurrence as an initial PSA value 0.2 ng/mL or greater followed by a subsequent confirmatory PSA value 0.2 ng/mL or greater. Values obtained with different assay methods or kits  cannot be used interchangeably. Results cannot be interpreted as absolute evidence  of the presence or absence of malignant disease.    WBC 6.5 3.4 - 10.8 x10E3/uL   RBC 4.76 4.14 - 5.80 x10E6/uL   Hemoglobin 14.9 13.0 - 17.7 g/dL   Hematocrit 28.4 13.2 - 51.0 %   MCV 91 79 - 97 fL   MCH 31.3 26.6 - 33.0 pg   MCHC 34.3 31.5 - 35.7 g/dL   RDW 44.0 10.2 - 72.5 %   Platelets 201 150 - 450 x10E3/uL   Neutrophils 52 Not Estab. %   Lymphs 38 Not Estab. %   Monocytes 8 Not Estab. %   Eos 2 Not Estab. %   Basos 0 Not Estab. %   Neutrophils Absolute 3.4 1.4 - 7.0 x10E3/uL   Lymphocytes Absolute 2.5 0.7 - 3.1 x10E3/uL   Monocytes Absolute 0.5 0.1 - 0.9 x10E3/uL   EOS (ABSOLUTE) 0.1 0.0 - 0.4 x10E3/uL   Basophils Absolute 0.0 0.0 - 0.2 x10E3/uL   Immature Granulocytes 0 Not Estab. %   Immature Grans (Abs) 0.0 0.0 - 0.1 x10E3/uL     Assessment/Plan: 1. Annual physical exam Pt is due for colonoscopy. He will let us know if he would like to proceed.     2. Essential hypertension, benign Continue Norvasc as previously prescribed.   3. Tinea corporis Take Fluconazole as prescribed.  We did discuss taking Terbinafine for 8-12 weeks might be a reasonable next step if Diflucan is unsuccessful.  - fluconazole (DIFLUCAN) 150 MG tablet; Take one tablet by mouth today, and then repeat every 5 days for a total of 3 doses.  Dispense: 3 tablet; Refill: 0     General Counseling: Coleby verbalizes understanding of the findings of todays visit and agrees with plan of treatment. I have discussed any further diagnostic evaluation that may be needed or ordered today. We also reviewed his medications today. he has been encouraged to call the office with any questions or concerns that should arise related to todays visit.    No orders of the defined types were placed in this encounter.   Meds ordered this encounter  Medications   fluconazole (DIFLUCAN) 150 MG tablet    Sig: Take one tablet by mouth  today, and then repeat every 5 days for a total of 3 doses.    Dispense:  3 tablet    Refill:  0    Total time spent:20 Minutes  Time spent includes review of chart, medications, test results, and follow up plan with the patient.    Johnna Acosta AGNP-C Nurse Practitioner

## 2023-04-27 NOTE — Progress Notes (Signed)
Pt presents today to complete physical, Pt denies any issues or concerns at this time/CL,Rma.

## 2023-05-24 ENCOUNTER — Other Ambulatory Visit: Payer: Self-pay | Admitting: Physician Assistant

## 2023-05-24 DIAGNOSIS — I10 Essential (primary) hypertension: Secondary | ICD-10-CM

## 2023-05-26 ENCOUNTER — Other Ambulatory Visit: Payer: Self-pay

## 2023-05-26 DIAGNOSIS — I10 Essential (primary) hypertension: Secondary | ICD-10-CM

## 2023-05-27 MED ORDER — AMLODIPINE BESYLATE 5 MG PO TABS: 5.0000 mg | ORAL_TABLET | Freq: Every day | ORAL | 3 refills | Status: DC

## 2023-07-18 ENCOUNTER — Ambulatory Visit: Payer: Self-pay | Admitting: Physician Assistant

## 2023-07-18 ENCOUNTER — Other Ambulatory Visit: Payer: Self-pay | Admitting: Physician Assistant

## 2023-07-18 ENCOUNTER — Encounter: Payer: Self-pay | Admitting: Physician Assistant

## 2023-07-18 DIAGNOSIS — G8929 Other chronic pain: Secondary | ICD-10-CM

## 2023-07-18 MED ORDER — DICLOFENAC SODIUM 1 % EX GEL: 2.0000 g | Freq: Four times a day (QID) | CUTANEOUS | 0 refills | Status: DC

## 2023-07-18 MED ORDER — DICLOFENAC SODIUM 1 % EX GEL: 2.0000 g | Freq: Four times a day (QID) | CUTANEOUS | Status: DC

## 2023-07-18 NOTE — Progress Notes (Signed)
Subjective: Chronic left knee pain    Patient ID: Michael Garrison, male    DOB: 02/08/1976, 47 y.o.   MRN: [id number]  HPI Patient complaining of intermitting left anterior knee pain for approximately 3 to 4 months.  States no provocative incident for complaint.  Patient states pain is similar to a ligament strain that he had in high school.  Denies loss of sensation or loss of function.  Describes the pain as "achy".  Rates the pain as a 5/10.   Review of Systems Negative except for above complaint    Objective:   Physical Exam Examination left knee shows no obvious deformity, edema, ecchymosis, or erythema.  No crepitus to palpation.  Full and equal range of motion.  Mild guarding palpation superior aspect of the patella.  Strength against resistance 5/5.       Assessment & Plan: Chronic knee pain   Patient given prescription for Voltaren gel to apply as directed 4 times a day.  Patient sent in imaging.  Patient will follow-up in 1 week.

## 2023-07-18 NOTE — Progress Notes (Signed)
Pt presents today with left knee pain for about a week. Pt states it pops when extending the knee. Examples, stairs or getting in patrol car.

## 2023-07-20 ENCOUNTER — Ambulatory Visit
Admission: RE | Admit: 2023-07-20 | Discharge: 2023-07-20 | Disposition: A | Discharge status: Home / Self Care | Payer: 59 | Attending: Physician Assistant | Admitting: Physician Assistant

## 2023-07-20 ENCOUNTER — Ambulatory Visit
Admission: RE | Admit: 2023-07-20 | Discharge: 2023-07-20 | Disposition: A | Discharge status: Home / Self Care | Payer: 59 | Source: Ambulatory Visit | Attending: Physician Assistant | Admitting: Physician Assistant

## 2023-07-20 DIAGNOSIS — G8929 Other chronic pain: Secondary | ICD-10-CM

## 2023-07-20 DIAGNOSIS — M25562 Pain in left knee: Secondary | ICD-10-CM | POA: Diagnosis present

## 2023-07-25 ENCOUNTER — Ambulatory Visit: Payer: Self-pay | Admitting: Physician Assistant

## 2023-07-27 ENCOUNTER — Ambulatory Visit: Payer: Self-pay | Admitting: Physician Assistant

## 2023-07-27 ENCOUNTER — Encounter: Payer: Self-pay | Admitting: Physician Assistant

## 2023-07-27 DIAGNOSIS — G8929 Other chronic pain: Secondary | ICD-10-CM

## 2023-07-27 MED ORDER — MELOXICAM 15 MG PO TABS: 15.0000 mg | ORAL_TABLET | Freq: Every day | ORAL | 0 refills | Status: DC

## 2023-07-27 NOTE — Progress Notes (Signed)
Subjective: Chronic left knee pain    Patient ID: Michael Garrison, male    DOB: April 07, 1976, 47 y.o.   MRN: [id number]  HPI Patient is follow-up for chronic left knee pain for approximately 4 months.  Patient was seen on 07/20/2023 and was sent for x-rays of the left knee.  No obvious findings on reviewing films but definitive report is not available.  Patient stated no relief with Voltaren gel.   Review of Systems Hypertension    Objective:   Physical Exam Was exam was deferred.       Assessment & Plan: Chronic left knee pain  Discussed no obvious bony abnormality on x-ray.  Patient given elastic knee support to wear while working.  Patient given prescription for meloxicam. Patient will be consulted to physical therapy for definitive evaluation and treatment.

## 2023-07-27 NOTE — Addendum Note (Signed)
Addended by: Gardner Candle on: 07/27/2023 10:41 AM   Modules accepted: Orders

## 2023-07-27 NOTE — Addendum Note (Signed)
Addended by: Gardner Candle on: 07/27/2023 11:53 AM   Modules accepted: Orders

## 2023-07-27 NOTE — Progress Notes (Signed)
Pt presents today for f/u on left knee pain. Pt states he still feels pain it has not gotten any better. Pt states it hurts most going up stairs or sitting to standing causes pain.

## 2023-08-03 ENCOUNTER — Other Ambulatory Visit: Payer: Self-pay

## 2023-08-03 ENCOUNTER — Ambulatory Visit: Payer: 59 | Attending: Physician Assistant

## 2023-08-03 DIAGNOSIS — G8929 Other chronic pain: Secondary | ICD-10-CM | POA: Diagnosis present

## 2023-08-03 DIAGNOSIS — M6281 Muscle weakness (generalized): Secondary | ICD-10-CM | POA: Diagnosis present

## 2023-08-03 DIAGNOSIS — M25562 Pain in left knee: Secondary | ICD-10-CM | POA: Insufficient documentation

## 2023-08-03 NOTE — Therapy (Addendum)
OUTPATIENT PHYSICAL THERAPY LOWER EXTREMITY EVALUATION   Patient Name: Michael Garrison MRN: [id number] DOB:[birth year], 47 y.o., male Today's Date: 08/03/2023  END OF SESSION:  PT End of Session - 08/03/23 1515     Visit Number 1    Number of Visits 9    Date for PT Re-Evaluation 09/28/23    PT Start Time 1430    PT Stop Time 1516    PT Time Calculation (min) 46 min    Activity Tolerance Patient tolerated treatment well    Behavior During Therapy [facility] for tasks assessed/performed             Past Medical History:  Diagnosis Date   HTN (hypertension)    Past Surgical History:  Procedure Laterality Date   VASECTOMY     Patient Active Problem List   Diagnosis Date Noted   Chest pain at rest 10/07/2020   Essential hypertension 10/07/2020    PCP: Michael Reining, PA-C  REFERRING PROVIDER: Joni Reining, PA-C  REFERRING DIAG:  [phone] (ICD-10-CM) - Chronic pain of left knee    THERAPY DIAG:  Chronic pain of left knee  Muscle weakness (generalized)  Rationale for Evaluation and Treatment: Rehabilitation  ONSET DATE: A year ago   SUBJECTIVE:   SUBJECTIVE STATEMENT:  Pt is a pleasant 47 y/o M presenting to OPPT w/ chronic L knee pain.   PERTINENT HISTORY: Pt presents to PT with chronic L knee pain, with the most recent flare up being a year ago. Pt reports a previous injury to bilat knees playing soccer back in high school affecting his "cross ligaments".  However, current knee pain is in the LLE > RLE located at the superior and inferior pole of the patella. Pt describes his pain as dull. Pt's pain is a 0/10 NPS at rest. Pt's pain is most aggravated during ascending and descending stairs, STS,  squatting and mild pain after prolonged sitting with worst pain being 6/10 NPS. Icy hot, Tylenol, and prescribed Meloxicam help to reduce pt's L knee pain. Pt reports non- painful popping and clicking pain along w/ intermittent N/T in the LLE.  PAIN:  Are you  having pain? Yes: NPRS scale: 0/10 Pain location: LLE> RLE, inferior and superior pole of patella  Pain description: Dull Aggravating factors: Walking up stairs, walking down stairs, sit to stand, mild pain after prolonged sitting, squatting, some non- painful popping when going downstairs Relieving factors: Icy hot, Meloxicam, Tylenol   PRECAUTIONS: None  RED FLAGS: None   WEIGHT BEARING RESTRICTIONS: No  FALLS:  Has patient fallen in last 6 months? No  OCCUPATION: Emergency planning/management officer   PLOF: Independent  PATIENT GOALS: Relieve the pain of the LLE   NEXT MD VISIT: N/A  OBJECTIVE:  Note: Objective measures were completed at Evaluation unless otherwise noted.  DIAGNOSTIC FINDINGS:  X-ray of L knee completed on 07/20/2023, no impression yet.  PATIENT SURVEYS:  FOTO 80/86  COGNITION: Overall cognitive status: Within functional limits for tasks assessed     SENSATION: WFL   MUSCLE LENGTH: Hamstrings: Right 80 deg; Left 75 deg  POSTURE: No Significant postural limitations  PALPATION: TTP felt at the inferior and superior pole of the L patella  LOWER EXTREMITY MMT:  MMT Right eval Left eval  Hip flexion 4+ 4+  Hip extension 4 4  Hip abduction 4 4  Hip adduction 4- 4-  Hip internal rotation 4 4  Hip external rotation 4 4  Knee flexion 4+ 4  Knee extension 4+  4+  Ankle dorsiflexion 4+ 4+  Ankle plantarflexion 4 4  Ankle inversion    Ankle eversion     (Blank rows = not tested)  LOWER EXTREMITY SPECIAL TESTS:  Hip special tests: Luisa Hart (FABER) test: negative, Hip scouring test: negative, and FADDIR: negative Knee special tests: Anterior drawer test: negative, Posterior drawer test: negative, McMurray's test: negative, and Clarke's sign: Negative, Valgus stress test: negative, Varus stress test: negative   FUNCTIONAL TESTS: Squat: Limited hip hinge with heel elevation and significant anterior tibial translation over knees with bilat knee valgus   Single leg  step down: Normal on RLE. Notable decreased motor control on LLE as evidenced with hip adduction/IR into knee valgus moment. Pt reports concordant pain and reported weakness in LLE compared to RLE.   TODAY'S TREATMENT:                                                                                                                              DATE:     PATIENT EDUCATION:  Education details: HEP given to pt Person educated: Patient Education method: Medical illustrator Education comprehension: verbalized understanding, returned demonstration, and needs further education  HOME EXERCISE PROGRAM: Access Code: [email or web address] URL: [email or web address] Date: 08/03/2023 Prepared by: Ronnie Derby  Exercises - Sitting Knee Extension with Resistance  - 1 x daily - 7 x weekly - 1 sets - 8 reps - 10 hold - Sidelying Hip Abduction with Resistance at Ankle  - 1 x daily - 3-4 x weekly - 3 sets - 8 reps - Standing 3-Way Leg Reach with Resistance at Ankles and Counter Support  - 1 x daily - 3-4 x weekly - 3 sets - 8 reps  ASSESSMENT:  CLINICAL IMPRESSION: Patient is a 47 y.o. M who was seen today for physical therapy evaluation and treatment for chronic L knee pain. Pt demonstrates no joint hypomobility in all planes w/ typical patellar movement and no AROM deficits in the L or R knee. Pt presents with deficits including superior and inferior pole of the patella, proximal hip weakness, and painful WB with squatting and stair negotiation (descending > ascending). Mild TTP noted w/ palpation, at the patellar tendon and quadriceps tendon of the L knee. MCL, ACL, PCL, and LCL testing of the L knee resulted in negative w/ no concordant pain along w/ meniscal testing also being negative. Pt notes concordant pain with L single squat assessment. These deficits are limiting participation in prolonged walking ADL's and stair negotiation due to moderate pain levels. Pt will benefit from skilled PT  services to address these deficits to reduce pain and maximize functional capacity with walking, steps, and work activities.    OBJECTIVE IMPAIRMENTS: decreased strength, impaired flexibility, and pain.   ACTIVITY LIMITATIONS: lifting, standing, squatting, and stairs  PARTICIPATION LIMITATIONS: community activity and occupation  PERSONAL FACTORS: Age and Time since onset of injury/illness/exacerbation are also affecting patient's functional outcome.   REHAB POTENTIAL: Good  CLINICAL  DECISION MAKING: Stable/uncomplicated  EVALUATION COMPLEXITY: Low   GOALS: Goals reviewed with patient? Yes  SHORT TERM GOALS: Target date: 08/31/23 Pt will be independent with HEP to improve bilat LE strength and reduce pain with functional activities  Baseline: Goal status: INITIAL  LONG TERM GOALS: Target date: 09/14/23  Pt will improve FOTO to target score to demonstrate clinically significant improvement in functional mobility.  Baseline:08/03/23: 80/86 Goal status: INITIAL  2.  Pt will decrease worst pain to <4/10 to demonstrate clinically significant improvement in pain levels with functional activity and ADL's.  Baseline: 08/03/23: 6/10 NPS Goal status: INITIAL  3.  Pt will self report 0/10 L knee pain when ascending and descending stairs to improve functional mobility during ADL's and work related tasks.  Baseline: 08/03/23: Pain at inferior and superior pole of patella w/ stair negotiation Goal status: INITIAL     PLAN:  PT FREQUENCY: 1x/week  PT DURATION: 8 weeks  PLANNED INTERVENTIONS: Therapeutic exercises, Therapeutic activity, Neuromuscular re-education, Balance training, Gait training, Patient/Family education, Self Care, Joint mobilization, Stair training, Cryotherapy, Moist heat, Manual therapy, and Re-evaluation  PLAN FOR NEXT SESSION: Reassess HEP   Lovie Macadamia, SPT  Delphia Grates. Fairly IV, PT, DPT Physical Therapist- [place]  [facility]   08/03/2023, 8:13 PM

## 2023-08-09 ENCOUNTER — Ambulatory Visit: Payer: 59

## 2023-08-17 ENCOUNTER — Ambulatory Visit: Payer: 59

## 2023-08-23 ENCOUNTER — Other Ambulatory Visit: Payer: Self-pay | Admitting: Physician Assistant

## 2023-09-07 ENCOUNTER — Ambulatory Visit: Payer: 59 | Attending: Physician Assistant

## 2023-09-07 DIAGNOSIS — M25562 Pain in left knee: Secondary | ICD-10-CM | POA: Diagnosis present

## 2023-09-07 DIAGNOSIS — G8929 Other chronic pain: Secondary | ICD-10-CM | POA: Insufficient documentation

## 2023-09-07 DIAGNOSIS — M6281 Muscle weakness (generalized): Secondary | ICD-10-CM | POA: Insufficient documentation

## 2023-09-07 NOTE — Therapy (Addendum)
OUTPATIENT PHYSICAL THERAPY LOWER EXTREMITY TREATMENT   Patient Name: Michael Garrison MRN: [id number] DOB:09-13-1976, 47 y.o., male Today's Date: 09/07/2023  END OF SESSION:  PT End of Session - 09/07/23 1344     Visit Number 2    Number of Visits 9    Date for PT Re-Evaluation 09/28/23    PT Start Time 1345    PT Stop Time 1425    PT Time Calculation (min) 40 min    Activity Tolerance Patient tolerated treatment well    Behavior During Therapy [facility] for tasks assessed/performed             Past Medical History:  Diagnosis Date   HTN (hypertension)    Past Surgical History:  Procedure Laterality Date   VASECTOMY     Patient Active Problem List   Diagnosis Date Noted   Chest pain at rest 10/07/2020   Essential hypertension 10/07/2020    PCP: Joni Reining, PA-C  REFERRING PROVIDER: Joni Reining, PA-C  REFERRING DIAG:  [id number] (ICD-10-CM) - Chronic pain of left knee    THERAPY DIAG:  Chronic pain of left knee  Muscle weakness (generalized)  Rationale for Evaluation and Treatment: Rehabilitation  ONSET DATE: A year ago   SUBJECTIVE:   SUBJECTIVE STATEMENT:  Pt reports 0/10NPS in the L knee at today's session. He reports being HEP compliant, and continues to have mild L knee pain with prolonged work- related tasks.  PERTINENT HISTORY: Pt presents to PT with chronic L knee pain, with the most recent flare up being a year ago. Pt reports a previous injury to bilat knees playing soccer back in high school affecting his "cross ligaments".  However, current knee pain is in the LLE > RLE located at the superior and inferior pole of the patella. Pt describes his pain as dull. Pt's pain is a 0/10 NPS at rest. Pt's pain is most aggravated during ascending and descending stairs, STS,  squatting and mild pain after prolonged sitting with worst pain being 6/10 NPS. Icy hot, Tylenol, and prescribed Meloxicam help to reduce pt's L knee pain. Pt reports non-  painful popping and clicking pain along w/ intermittent N/T in the LLE.  PAIN:  Are you having pain? Yes: NPRS scale: 0/10 Pain location: LLE> RLE, inferior and superior pole of patella  Pain description: Dull Aggravating factors: Walking up stairs, walking down stairs, sit to stand, mild pain after prolonged sitting, squatting, some non- painful popping when going downstairs Relieving factors: Icy hot, Meloxicam, Tylenol   PRECAUTIONS: None  RED FLAGS: None   WEIGHT BEARING RESTRICTIONS: No  FALLS:  Has patient fallen in last 6 months? No  OCCUPATION: Emergency planning/management officer   PLOF: Independent  PATIENT GOALS: Relieve the pain of the LLE   NEXT MD VISIT: N/A  OBJECTIVE:  Note: Objective measures were completed at Evaluation unless otherwise noted.  DIAGNOSTIC FINDINGS:  X-ray of L knee completed on 07/20/2023, no impression yet.  PATIENT SURVEYS:  FOTO 80/86  COGNITION: Overall cognitive status: Within functional limits for tasks assessed     SENSATION: WFL   MUSCLE LENGTH: Hamstrings: Right 80 deg; Left 75 deg  POSTURE: No Significant postural limitations  PALPATION: TTP felt at the inferior and superior pole of the L patella  LOWER EXTREMITY MMT:  MMT Right eval Left eval  Hip flexion 4+ 4+  Hip extension 4 4  Hip abduction 4 4  Hip adduction 4- 4-  Hip internal rotation 4 4  Hip external  rotation 4 4  Knee flexion 4+ 4  Knee extension 4+ 4+  Ankle dorsiflexion 4+ 4+  Ankle plantarflexion 4 4  Ankle inversion    Ankle eversion     (Blank rows = not tested)  LOWER EXTREMITY SPECIAL TESTS:  Hip special tests: Luisa Hart (FABER) test: negative, Hip scouring test: negative, and FADDIR: negative Knee special tests: Anterior drawer test: negative, Posterior drawer test: negative, McMurray's test: negative, and Clarke's sign: Negative, Valgus stress test: negative, Varus stress test: negative   FUNCTIONAL TESTS: Squat: Limited hip hinge with heel elevation and  significant anterior tibial translation over knees with bilat knee valgus   Single leg step down: Normal on RLE. Notable decreased motor control on LLE as evidenced with hip adduction/IR into knee valgus moment. Pt reports concordant pain and reported weakness in LLE compared to RLE.   TODAY'S TREATMENT:                                                                                                                              DATE:  09/07/23  Recumbent bike x5 minutes lvl 4.0 Seat #16 to improve LE strength and mobility Reviewed pt's HEP (reps/sets/freq)   Seated knee ext isometric 10 sec hold x10  Standing hip abduction w/ GTB RLE/LLE w/ bilat UE support x10/ each side w/ 5 second hold at end range abduction  Standing hip extension w/ GTB RLE/LLE w/ bilat UE support x10/ each side w/ 5 second hold at end range extension Wall sits against wall w/ bilat LE's 3 x20 seconds Standing reverse lunges at TRX strap RLE/LLE 2 x10/ each side Standing reverse lunges over level ground RLE/LLE 2x6/ each side Monsterwalks w/ blue TB around distal thighs 2 x25' down and back  PATIENT EDUCATION:  Education details: HEP given to pt Person educated: Patient Education method: Medical illustrator Education comprehension: verbalized understanding, returned demonstration, and needs further education  HOME EXERCISE PROGRAM: Access Code: [email or web address] URL: [email or web address] Date: 09/07/2023 Prepared by: Ronnie Derby  Exercises - Sidelying Hip Abduction with Resistance at Ankle  - 1 x daily - 3-4 x weekly - 3 sets - 8 reps - Standing 3-Way Leg Reach with Resistance at Ankles and Counter Support  - 1 x daily - 3-4 x weekly - 3 sets - 8 reps - Wall Squat  - 1 x daily - 3-4 x weekly - 1 sets - 3 reps - 25 hold - Standard Lunge  - 1 x daily - 3-4 x weekly - 2 sets - 10 reps   Access Code: [email or web address] URL: [email or web address] Date: 08/03/2023 Prepared by: Ronnie Derby  Exercises - Sitting Knee Extension with Resistance  - 1 x daily - 7 x weekly - 1 sets - 8 reps - 10 hold - Sidelying Hip Abduction with Resistance at Ankle  - 1 x daily - 3-4 x weekly - 3 sets - 8 reps - Standing 3-Way Leg Reach with Resistance at Ankles and  Counter Support  - 1 x daily - 3-4 x weekly - 3 sets - 8 reps  ASSESSMENT:  CLINICAL IMPRESSION: Session focused on reassessing pt's HEP and progressing bilat hip and L knee strengthening activities. Pt reports improved activity tolerance to HEP updates, including standing hip abduction w/ GTB, wall sits, and forward lunges. Pt continues to note mild pain with strenuous activities, particularly when carrying his 50 year old son upstairs and with prolonged walking or cutting activities. Pt will continue to benefit from skilled PT services to address these deficits to reduce pain and maximize functional capacity with walking, steps, and work activities.    OBJECTIVE IMPAIRMENTS: decreased strength, impaired flexibility, and pain.   ACTIVITY LIMITATIONS: lifting, standing, squatting, and stairs  PARTICIPATION LIMITATIONS: community activity and occupation  PERSONAL FACTORS: Age and Time since onset of injury/illness/exacerbation are also affecting patient's functional outcome.   REHAB POTENTIAL: Good  CLINICAL DECISION MAKING: Stable/uncomplicated  EVALUATION COMPLEXITY: Low   GOALS: Goals reviewed with patient? Yes  SHORT TERM GOALS: Target date: 08/31/23 Pt will be independent with HEP to improve bilat LE strength and reduce pain with functional activities  Baseline: Goal status: INITIAL  LONG TERM GOALS: Target date: 09/14/23  Pt will improve FOTO to target score to demonstrate clinically significant improvement in functional mobility.  Baseline:08/03/23: 80/86 Goal status: INITIAL  2.  Pt will decrease worst pain to <4/10 to demonstrate clinically significant improvement in pain levels with functional activity and  ADL's.  Baseline: 08/03/23: 6/10 NPS Goal status: INITIAL  3.  Pt will self report 0/10 L knee pain when ascending and descending stairs to improve functional mobility during ADL's and work related tasks.  Baseline: 08/03/23: Pain at inferior and superior pole of patella w/ stair negotiation Goal status: INITIAL     PLAN:  PT FREQUENCY: 1x/week  PT DURATION: 8 weeks  PLANNED INTERVENTIONS: Therapeutic exercises, Therapeutic activity, Neuromuscular re-education, Balance training, Gait training, Patient/Family education, Self Care, Joint mobilization, Stair training, Cryotherapy, Moist heat, Manual therapy, and Re-evaluation  PLAN FOR NEXT SESSION: Reassess tolerance to new HEP additions, progress bilat hip and knee strengthening exercises.  Lovie Macadamia, SPT  Delphia Grates. Fairly IV, PT, DPT Physical Therapist-   [facility]  09/07/2023, 3:38 PM

## 2023-09-13 ENCOUNTER — Ambulatory Visit: Payer: 59

## 2023-09-14 ENCOUNTER — Ambulatory Visit: Payer: 59

## 2023-09-14 DIAGNOSIS — M6281 Muscle weakness (generalized): Secondary | ICD-10-CM

## 2023-09-14 DIAGNOSIS — M25562 Pain in left knee: Secondary | ICD-10-CM | POA: Diagnosis not present

## 2023-09-14 DIAGNOSIS — G8929 Other chronic pain: Secondary | ICD-10-CM

## 2023-09-14 NOTE — Therapy (Addendum)
OUTPATIENT PHYSICAL THERAPY LOWER EXTREMITY TREATMENT   Patient Name: Michael Garrison MRN: [id number] DOB:01-18-1976, 47 y.o., male Today's Date: 09/14/2023  END OF SESSION:  PT End of Session - 09/14/23 1517     Visit Number 3    Number of Visits 9    Date for PT Re-Evaluation 09/28/23    PT Start Time 1517    PT Stop Time 1558    PT Time Calculation (min) 41 min    Activity Tolerance Patient tolerated treatment well    Behavior During Therapy [facility] for tasks assessed/performed             Past Medical History:  Diagnosis Date   HTN (hypertension)    Past Surgical History:  Procedure Laterality Date   VASECTOMY     Patient Active Problem List   Diagnosis Date Noted   Chest pain at rest 10/07/2020   Essential hypertension 10/07/2020    PCP: Joni Reining, PA-C  REFERRING PROVIDER: Joni Reining, PA-C  REFERRING DIAG:  [phone] (ICD-10-CM) - Chronic pain of left knee    THERAPY DIAG:  Chronic pain of left knee  Muscle weakness (generalized)  Rationale for Evaluation and Treatment: Rehabilitation  ONSET DATE: A year ago   SUBJECTIVE:   SUBJECTIVE STATEMENT:  Pt reports 0/10NPS in the L knee at today's session. He reports being HEP compliant to new exercise progression updates. No notable changes since last session.   PERTINENT HISTORY: Pt presents to PT with chronic L knee pain, with the most recent flare up being a year ago. Pt reports a previous injury to bilat knees playing soccer back in high school affecting his "cross ligaments".  However, current knee pain is in the LLE > RLE located at the superior and inferior pole of the patella. Pt describes his pain as dull. Pt's pain is a 0/10 NPS at rest. Pt's pain is most aggravated during ascending and descending stairs, STS,  squatting and mild pain after prolonged sitting with worst pain being 6/10 NPS. Icy hot, Tylenol, and prescribed Meloxicam help to reduce pt's L knee pain. Pt reports non-  painful popping and clicking pain along w/ intermittent N/T in the LLE.  PAIN:  Are you having pain? Yes: NPRS scale: 0/10 Pain location: LLE> RLE, inferior and superior pole of patella  Pain description: Dull Aggravating factors: Walking up stairs, walking down stairs, sit to stand, mild pain after prolonged sitting, squatting, some non- painful popping when going downstairs Relieving factors: Icy hot, Meloxicam, Tylenol   PRECAUTIONS: None  RED FLAGS: None   WEIGHT BEARING RESTRICTIONS: No  FALLS:  Has patient fallen in last 6 months? No  OCCUPATION: Emergency planning/management officer   PLOF: Independent  PATIENT GOALS: Relieve the pain of the LLE   NEXT MD VISIT: N/A  OBJECTIVE:  Note: Objective measures were completed at Evaluation unless otherwise noted.  DIAGNOSTIC FINDINGS:  X-ray of L knee completed on 07/20/2023, no impression yet.  PATIENT SURVEYS:  FOTO 80/86  COGNITION: Overall cognitive status: Within functional limits for tasks assessed     SENSATION: WFL   MUSCLE LENGTH: Hamstrings: Right 80 deg; Left 75 deg  POSTURE: No Significant postural limitations  PALPATION: TTP felt at the inferior and superior pole of the L patella  LOWER EXTREMITY MMT:  MMT Right eval Left eval  Hip flexion 4+ 4+  Hip extension 4 4  Hip abduction 4 4  Hip adduction 4- 4-  Hip internal rotation 4 4  Hip external rotation  4 4  Knee flexion 4+ 4  Knee extension 4+ 4+  Ankle dorsiflexion 4+ 4+  Ankle plantarflexion 4 4  Ankle inversion    Ankle eversion     (Blank rows = not tested)  LOWER EXTREMITY SPECIAL TESTS:  Hip special tests: Luisa Hart (FABER) test: negative, Hip scouring test: negative, and FADDIR: negative Knee special tests: Anterior drawer test: negative, Posterior drawer test: negative, McMurray's test: negative, and Clarke's sign: Negative, Valgus stress test: negative, Varus stress test: negative   FUNCTIONAL TESTS: Squat: Limited hip hinge with heel elevation and  significant anterior tibial translation over knees with bilat knee valgus   Single leg step down: Normal on RLE. Notable decreased motor control on LLE as evidenced with hip adduction/IR into knee valgus moment. Pt reports concordant pain and reported weakness in LLE compared to RLE.   TODAY'S TREATMENT:                                                                                                                              DATE:  09/14/23  Recumbent bike x5 minutes lvl 4.0 Seat #16 to improve LE strength and mobility Monsterwalks w/ blue TB around distal thighs 4 x10' down and back Walking lunges w/ 5# DB's in bilat UE's 3 x25' down and back  Seated knee extension at [facility] machine 3 x10 45#  Seated knee flexion at OMEGA machine 3 x10 45#  Supine hamstring stretch LLE/RLE 3 x30 sec /each Seated leg press bilat LE's 85# 3 x10  PATIENT EDUCATION:  Education details: HEP given to pt Person educated: Patient Education method: Medical illustrator Education comprehension: verbalized understanding, returned demonstration, and needs further education  HOME EXERCISE PROGRAM: Access Code: [email or web address] URL: [email or web address] Date: 09/07/2023 Prepared by: Ronnie Derby  Exercises - Sidelying Hip Abduction with Resistance at Ankle  - 1 x daily - 3-4 x weekly - 3 sets - 8 reps - Standing 3-Way Leg Reach with Resistance at Ankles and Counter Support  - 1 x daily - 3-4 x weekly - 3 sets - 8 reps - Wall Squat  - 1 x daily - 3-4 x weekly - 1 sets - 3 reps - 25 hold - Standard Lunge  - 1 x daily - 3-4 x weekly - 2 sets - 10 reps   Access Code: [email or web address] URL: [email or web address] Date: 08/03/2023 Prepared by: Ronnie Derby  Exercises - Sitting Knee Extension with Resistance  - 1 x daily - 7 x weekly - 1 sets - 8 reps - 10 hold - Sidelying Hip Abduction with Resistance at Ankle  - 1 x daily - 3-4 x weekly - 3 sets - 8 reps - Standing 3-Way Leg Reach with  Resistance at Ankles and Counter Support  - 1 x daily - 3-4 x weekly - 3 sets - 8 reps  ASSESSMENT:  CLINICAL IMPRESSION: Session focused on progressing bilat LE strengthening exercises. Pt noted improvements in bilat LE strength, as evidenced by  increased activity tolerance and the ability to progress exercises to tolerance without pain. Pt did not report any pain during today's session, indicating improved overall strength and function. However, pt experienced decreased L hamstring mobility, with a cramp in the L hamstring following hamstring curl activity. This was managed by completing supine hamstring stretches, which helped alleviate the discomfort. Pt would continue to benefit from skilled PT interventions to address remaining L knee pain, hamstring mobility limitations, and bilat LE strength deficits to maximize functional capacity with walking, steps, and work activities.    OBJECTIVE IMPAIRMENTS: decreased strength, impaired flexibility, and pain.   ACTIVITY LIMITATIONS: lifting, standing, squatting, and stairs  PARTICIPATION LIMITATIONS: community activity and occupation  PERSONAL FACTORS: Age and Time since onset of injury/illness/exacerbation are also affecting patient's functional outcome.   REHAB POTENTIAL: Good  CLINICAL DECISION MAKING: Stable/uncomplicated  EVALUATION COMPLEXITY: Low   GOALS: Goals reviewed with patient? Yes  SHORT TERM GOALS: Target date: 08/31/23 Pt will be independent with HEP to improve bilat LE strength and reduce pain with functional activities  Baseline: Goal status: INITIAL  LONG TERM GOALS: Target date: 09/28/23  Pt will improve FOTO to target score to demonstrate clinically significant improvement in functional mobility.  Baseline:08/03/23: 80/86 Goal status: INITIAL  2.  Pt will decrease worst pain to <4/10 to demonstrate clinically significant improvement in pain levels with functional activity and ADL's.  Baseline: 08/03/23: 6/10  NPS Goal status: INITIAL  3.  Pt will self report 0/10 L knee pain when ascending and descending stairs to improve functional mobility during ADL's and work related tasks.  Baseline: 08/03/23: Pain at inferior and superior pole of patella w/ stair negotiation Goal status: INITIAL     PLAN:  PT FREQUENCY: 1x/week  PT DURATION: 8 weeks  PLANNED INTERVENTIONS: Therapeutic exercises, Therapeutic activity, Neuromuscular re-education, Balance training, Gait training, Patient/Family education, Self Care, Joint mobilization, Stair training, Cryotherapy, Moist heat, Manual therapy, and Re-evaluation  PLAN FOR NEXT SESSION: Continue to progress bilat hip and knee strengthening exercises.  Lovie Macadamia, SPT  Delphia Grates. Fairly IV, PT, DPT Physical Therapist- [place]  [facility]  09/14/2023, 8:06 PM

## 2023-10-05 ENCOUNTER — Ambulatory Visit: Payer: 59

## 2023-11-29 ENCOUNTER — Ambulatory Visit: Payer: 59 | Admitting: Student

## 2023-11-29 VITALS — BP 137/93 | HR 68 | Resp 16 | Ht 67.0 in | Wt 185.0 lb

## 2023-11-29 DIAGNOSIS — R1030 Lower abdominal pain, unspecified: Secondary | ICD-10-CM

## 2023-11-29 DIAGNOSIS — R1032 Left lower quadrant pain: Secondary | ICD-10-CM

## 2023-11-29 LAB — POCT URINALYSIS DIPSTICK
Bilirubin, UA: NEGATIVE
Blood, UA: NEGATIVE
Glucose, UA: NEGATIVE
Ketones, UA: NEGATIVE
Leukocytes, UA: NEGATIVE
Nitrite, UA: NEGATIVE
Protein, UA: NEGATIVE
Spec Grav, UA: 1.01 (ref 1.010–1.025)
Urobilinogen, UA: 1 U/dL
pH, UA: 7 (ref 5.0–8.0)

## 2023-11-29 NOTE — Progress Notes (Signed)
Acute Office Visit  Subjective:     Patient ID: Michael Garrison, male    DOB: 1976/05/21, 48 y.o.   MRN: [id number]  Chief Complaint  Patient presents with   Abdominal Pain    HPI Patient is in today for low midline and left-sided abdominal pain.  Patient states symptoms been present for approximately 2 days with no known injury or trauma.  No significant changes to his diet.  States 2 bowel movements this morning but unable to determine if it was a normal bowel movement, but does note some straining.  Complains of persistent cramping that initially started on the left low suprapubic of the abdomen but is now more localized midline.  No urinary changes.  No fever or chills.  He did have some heartburn this morning.  He has attempted management with dilute MiraLAX which he is about a third of the way through.    Objective:    BP (!) 137/93   Pulse 68   Resp 16   Ht 5\' 7"  (1.702 m)   Wt 185 lb (83.9 kg)   BMI 28.98 kg/m    Physical Exam Constitutional:      General: He is not in acute distress.    Appearance: He is not toxic-appearing.  Cardiovascular:     Rate and Rhythm: Normal rate.  Abdominal:     General: Bowel sounds are normal. There is no distension.     Palpations: Abdomen is soft. There is no splenomegaly, mass or pulsatile mass.     Tenderness: There is abdominal tenderness in the suprapubic area and left lower quadrant. There is no left CVA tenderness, guarding or rebound. Negative signs include Rovsing's sign and McBurney's sign.     Hernia: No hernia is present.  Neurological:     Mental Status: He is alert.     Results for orders placed or performed in visit on 11/29/23  POCT urinalysis dipstick  Result Value Ref Range   Color, UA yellow    Clarity, UA clear    Glucose, UA Negative Negative   Bilirubin, UA neg    Ketones, UA neg    Spec Grav, UA 1.010 1.010 - 1.025   Blood, UA neg    pH, UA 7.0 5.0 - 8.0   Protein, UA Negative Negative    Urobilinogen, UA 1.0 0.2 or 1.0 E.U./dL   Nitrite, UA neg    Leukocytes, UA Negative Negative   Appearance     Odor          Assessment & Plan:  Impression: Acute abdominal pain Plan: Continue stool softener, soft food, warm compress, hydrate Discussed physical exam findings with patient.  Patient presents with approximately 2 days of lower abdominal pain.  Patient states symptoms initially started in the left suprapubic region but have now become more midline.  No tenderness in the right lower quadrant.  No rebound tenderness.  Bowel sounds were normal.  Urinalysis was completed which showed no abnormalities.  Patient denies any urinary changes.  Symptoms consistent with more gastrointestinal origin.  Recommended continuing stool softener, soft food, hydrate, warm compress for cramping feeling.  Advised to continue to monitor.  Recommend follow-up next week if symptoms have not improved.  Advised to present to clinic sooner if symptoms worsen.  Patient expressed understanding and agreement plan. Problem List Items Addressed This Visit   None Visit Diagnoses       Lower abdominal pain    -  Primary   Relevant Orders  POCT urinalysis dipstick (Completed)       No orders of the defined types were placed in this encounter.   No follow-ups on file.  Ivar Drape, PA-C

## 2023-11-29 NOTE — Progress Notes (Signed)
Pt presents today with some lower abd pain x 2days. Pt describes the pain as cramps. He states he has noticed some straining while using the restroom and taking longer to pass stool.

## 2024-01-08 ENCOUNTER — Ambulatory Visit: Payer: Self-pay | Admitting: Physician Assistant

## 2024-01-08 ENCOUNTER — Encounter: Payer: Self-pay | Admitting: Physician Assistant

## 2024-01-08 VITALS — BP 134/89 | HR 74 | Temp 97.6°F | Resp 14 | Ht 68.0 in | Wt 185.0 lb

## 2024-01-08 DIAGNOSIS — R1032 Left lower quadrant pain: Secondary | ICD-10-CM

## 2024-01-08 MED ORDER — GENTAMICIN SULFATE 0.3 % OP SOLN: 1.0000 [drp] | OPHTHALMIC | 0 refills | Status: DC

## 2024-01-08 NOTE — Progress Notes (Signed)
 Pt presents today with on going pain off and on since last visit lower abd pain. When he gets the urge to go to restroom, pt describes it as discomfort when bladder gets full.  Pt is also dealing with pink eye.

## 2024-01-08 NOTE — Progress Notes (Signed)
   Subjective: Left inguinal pain    Patient ID: Michael Garrison, male    DOB: 01/01/1976, 48 y.o.   MRN: [id number]  HPI Patient complain of 2 months of left inguinal pain.  Patient was seen 2 months ago by another provider and was told it might be constipation.  Patient states pain is intermittent increase with squatting and exertion.  No specific provocative incident for complaint.  Denies urinary complaints.  Patient also complaining of conjunctivitis.  Denies vision disturbance.  States matted eyelids upon a.m. awakening.  Condition for approximately 3 to 5 days.  States no relief using wife's oral antibiotic eyedrops.  Review of Systems Hypertension    Objective:   Physical Exam BP 134/89  Cuff Size Large  Pulse Rate 74  Temp 97.6 F (36.4 C)  Temp Source Temporal  Weight 185 lb (83.9 kg)  Height 5\' 8"  (1.727 m)  Resp 14  SpO2 96 %   BMI: 28.13 kg/m2  BSA: 2.01 m2  HEENT is unremarkable. Neck is supple for lymphadenopathy or bruits. Lungs are clear to auscultation. Abdomen with negative HSM, normoactive bowel sounds, soft and nontender to palpation. Patient is mild guarding left inguinal area.  No palpable lesion.  Urinalysis unremarkable.       Assessment & Plan: Left inguinal strain versus hernia   Further evaluation for ultrasound.  Patient given prescription for gentamicin eyedrops.  Follow-up in 3 days.

## 2024-01-11 ENCOUNTER — Ambulatory Visit
Admission: RE | Admit: 2024-01-11 | Discharge: 2024-01-11 | Disposition: A | Discharge status: Home / Self Care | Source: Ambulatory Visit | Attending: Physician Assistant | Admitting: Physician Assistant

## 2024-01-11 DIAGNOSIS — R1032 Left lower quadrant pain: Secondary | ICD-10-CM

## 2024-01-12 ENCOUNTER — Ambulatory Visit

## 2024-02-06 ENCOUNTER — Ambulatory Visit: Payer: Self-pay

## 2024-02-06 DIAGNOSIS — Z0000 Encounter for general adult medical examination without abnormal findings: Secondary | ICD-10-CM

## 2024-02-06 LAB — POCT URINALYSIS DIPSTICK
Bilirubin, UA: NEGATIVE
Blood, UA: NEGATIVE
Glucose, UA: NEGATIVE
Ketones, UA: NEGATIVE
Leukocytes, UA: NEGATIVE
Nitrite, UA: NEGATIVE
Protein, UA: NEGATIVE
Spec Grav, UA: 1.01 (ref 1.010–1.025)
Urobilinogen, UA: 0.2 U/dL
pH, UA: 7 (ref 5.0–8.0)

## 2024-02-08 LAB — CMP12+LP+TP+TSH+6AC+PSA+CBC…
ALT: 25 IU/L (ref 0–44)
AST: 23 IU/L (ref 0–40)
Albumin: 4.8 g/dL (ref 4.1–5.1)
Alkaline Phosphatase: 95 IU/L (ref 44–121)
BUN/Creatinine Ratio: 12 (ref 9–20)
BUN: 10 mg/dL (ref 6–24)
Basophils Absolute: 0 10*3/uL (ref 0.0–0.2)
Basos: 0 %
Bilirubin Total: 0.6 mg/dL (ref 0.0–1.2)
Calcium: 9.4 mg/dL (ref 8.7–10.2)
Chloride: 102 mmol/L (ref 96–106)
Chol/HDL Ratio: 2.9 ratio (ref 0.0–5.0)
Cholesterol, Total: 120 mg/dL (ref 100–199)
Creatinine, Ser: 0.83 mg/dL (ref 0.76–1.27)
EOS (ABSOLUTE): 0.3 10*3/uL (ref 0.0–0.4)
Eos: 4 %
Estimated CHD Risk: <0.5 times avg. (ref 0.0–1.0)
Free Thyroxine Index: 2.2 (ref 1.2–4.9)
GGT: 17 IU/L (ref 0–65)
Globulin, Total: 2.6 g/dL (ref 1.5–4.5)
Glucose: 85 mg/dL (ref 70–99)
HDL: 42 mg/dL (ref >39)
Hematocrit: 45.5 % (ref 37.5–51.0)
Hemoglobin: 15.7 g/dL (ref 13.0–17.7)
Immature Grans (Abs): 0 10*3/uL (ref 0.0–0.1)
Immature Granulocytes: 0 %
Iron: 41 ug/dL (ref 38–169)
LDH: 204 IU/L (ref 121–224)
LDL Chol Calc (NIH): 58 mg/dL (ref 0–99)
Lymphocytes Absolute: 2 10*3/uL (ref 0.7–3.1)
Lymphs: 23 %
MCH: 32.8 pg (ref 26.6–33.0)
MCHC: 34.5 g/dL (ref 31.5–35.7)
MCV: 95 fL (ref 79–97)
Monocytes Absolute: 0.6 10*3/uL (ref 0.1–0.9)
Monocytes: 6 %
Neutrophils Absolute: 5.8 10*3/uL (ref 1.4–7.0)
Neutrophils: 67 %
Phosphorus: 3.4 mg/dL (ref 2.8–4.1)
Platelets: 187 10*3/uL (ref 150–450)
Potassium: 3.9 mmol/L (ref 3.5–5.2)
Prostate Specific Ag, Serum: 0.4 ng/mL (ref 0.0–4.0)
RBC: 4.79 x10E6/uL (ref 4.14–5.80)
RDW: 12.4 % (ref 11.6–15.4)
Sodium: 141 mmol/L (ref 134–144)
T3 Uptake Ratio: 29 % (ref 24–39)
T4, Total: 7.5 ug/dL (ref 4.5–12.0)
TSH: 0.917 u[IU]/mL (ref 0.450–4.500)
Total Protein: 7.4 g/dL (ref 6.0–8.5)
Triglycerides: 111 mg/dL (ref 0–149)
Uric Acid: 6.4 mg/dL (ref 3.8–8.4)
VLDL Cholesterol Cal: 20 mg/dL (ref 5–40)
WBC: 8.7 10*3/uL (ref 3.4–10.8)
eGFR: 107 mL/min/{1.73_m2} (ref >59)

## 2024-02-13 ENCOUNTER — Ambulatory Visit: Payer: Self-pay

## 2024-02-13 VITALS — BP 130/72 | Temp 98.7°F | Resp 54 | Ht 68.0 in | Wt 174.0 lb

## 2024-02-13 DIAGNOSIS — Z0000 Encounter for general adult medical examination without abnormal findings: Secondary | ICD-10-CM

## 2024-02-13 NOTE — Progress Notes (Signed)
 Michael Garrison, 48 y.o. male, presents for annual physical.   PCP: Marcina Severe, PA-C   Last Labs on 02/06/2024 Component Ref Range & Units (hover) 7 d ago (02/06/24) 2 mo ago (11/29/23) 9 mo ago (04/25/23) 2 yr ago (02/11/22) 2 yr ago (03/08/21) 3 yr ago (04/10/20)  Color, UA Yellow yellow Light Yellow yellow Light Yellow light yellow  Clarity, UA Clear clear Clear clear Clear clear  Glucose, UA Negative Negative Negative Negative Negative Negative  Bilirubin, UA Negative neg Negative negative Negative negative  Ketones, UA Negative neg Negative negative Negative negative  Spec Grav, UA 1.010 1.010 1.010 1.015 1.010 1.015  Blood, UA Negative neg Negative negative Negative negative  pH, UA 7.0 7.0 6.0 6.0 6.0 6.0  Protein, UA Negative Negative Negative Negative Negative Negative  Urobilinogen, UA 0.2 1.0 CM 0.2 0.2 0.2 0.2  Nitrite, UA Negative neg Negative negative Negative negative  Leukocytes, UA Negative Negative Negative Negative Negative Negative  Appearance        Odor              Specimen Collected: 02/06/24 10:17 Last Resulted: 02/06/24 10:17   Component Ref Range & Units (hover) 7 d ago (02/06/24) 9 mo ago (04/25/23) 2 yr ago (02/11/22) 2 yr ago (03/08/21) 3 yr ago (10/01/20) 3 yr ago (10/01/20) 3 yr ago (04/10/20)  Glucose 85 93 116 High  85 R 100 High  CM  73 R  Uric Acid 6.4 7.2 CM 7.4 CM 6.8 CM   7.1 CM  Comment:            Therapeutic target for gout patients: <6.0  BUN 10 12 12 12 14  R  16  Creatinine, Ser 0.83 0.82 0.90 0.87 0.88 R  0.87  eGFR 107 108 106 108     BUN/Creatinine Ratio 12 15 13 14   18   Sodium 141 138 144 140 138 R  140  Potassium 3.9 4.3 4.8 4.0 4.0 R  4.6  Chloride 102 103 103 102 101 R  103  Calcium 9.4 9.6 9.8 9.7 9.3 R  9.2  Phosphorus 3.4 3.2 3.5 3.2   3.3  Total Protein 7.4 7.0 7.6 7.2   7.0  Albumin 4.8 4.6 4.9 R 4.8 R   4.6 R  Globulin, Total 2.6 2.4 2.7 2.4   2.4  Bilirubin Total 0.6 0.6 0.6 0.6   0.6  Alkaline Phosphatase 95 84 75  86   70 R  LDH 204 186 214 198   211  AST 23 24 33 29   24  ALT 25 30 45 High  40   32  GGT 17 22 27 31   23   Iron 41 116 124 110   121  Cholesterol, Total 120 132 148 147   135  Triglycerides 111 96 119 182 High    120  HDL 42 49 50 40   40  VLDL Cholesterol Cal 20 18 21 31   22   LDL Chol Calc (NIH) 58 65 77 76   73  Chol/HDL Ratio 2.9 2.7 CM 3.0 CM 3.7 CM   3.4 CM  Comment:                                   T. Chol/HDL Ratio  Men  Women                               1/2 Avg.Risk  3.4    3.3                                   Avg.Risk  5.0    4.4                                2X Avg.Risk  9.6    7.1                                3X Avg.Risk 23.4   11.0  Estimated CHD Risk  < 0.5  < 0.5 CM  < 0.5 CM 0.6 CM   0.5 CM  Comment: The CHD Risk is based on the T. Chol/HDL ratio. Other factors affect CHD Risk such as hypertension, smoking, diabetes, severe obesity, and family history of premature CHD.  TSH 0.917 1.030 1.360 1.070   0.687  T4, Total 7.5 6.6 8.5 7.1   6.8  T3 Uptake Ratio 29 28 33 26   27  Free Thyroxine Index 2.2 1.8 2.8 1.8   1.8  Prostate Specific Ag, Serum 0.4 0.3 CM  0.5 CM   0.4 CM  Comment: Roche ECLIA methodology. According to the American Urological Association, Serum PSA should decrease and remain at undetectable levels after radical prostatectomy. The AUA defines biochemical recurrence as an initial PSA value 0.2 ng/mL or greater followed by a subsequent confirmatory PSA value 0.2 ng/mL or greater. Values obtained with different assay methods or kits cannot be used interchangeably. Results cannot be interpreted as absolute evidence of the presence or absence of malignant disease.  WBC 8.7 6.5 7.0 7.1  7.5 R 6.7  RBC 4.79 4.76 4.95 4.92  4.80 R 4.60  Hemoglobin 15.7 14.9 16.2 15.7  15.5 R 14.8  Hematocrit 45.5 43.5 45.9 44.5  43.7 R 42.7  MCV 95 91 93 90  91.0 R 93  MCH 32.8 31.3 32.7 31.9  32.3 R 32.2  MCHC 34.5  34.3 35.3 35.3  35.5 R 34.7  RDW 12.4 12.0 12.3 12.6  13.6 R 12.2  Platelets 187 201 203 202  195 R 175  Neutrophils 67 52 64 58   56  Lymphs 23 38 28 31   35  Monocytes 6 8 7 8   7   Eos 4 2 1 2   2   Basos 0 0 0 1   0  Neutrophils Absolute 5.8 3.4 4.4 4.1   3.7  Lymphocytes Absolute 2.0 2.5 2.0 2.2   2.3  Monocytes Absolute 0.6 0.5 0.5 0.6   0.5  EOS (ABSOLUTE) 0.3 0.1 0.1 0.2   0.1  Basophils Absolute 0.0 0.0 0.0 0.0   0.0  Immature Granulocytes 0 0 0 0   0  Immature Grans (Abs) 0.0 0.0 0.0 0.0   0.0      Vaccinations Immunization History  Administered Date(s) Administered   Hepatitis B 09/27/2016, 12/02/2016, 12/14/2017   Influenza,inj,Quad PF,6+ Mos 08/30/2019   Influenza-Unspecified 09/09/2020   Moderna Sars-Covid-2 Vaccination 10/30/2019, 11/29/2019, 09/09/2020   Tdap 12/14/2017      Health Maintenance Health Maintenance  Topic Date  Due   HIV Screening  Never done   Hepatitis C Screening  Never done   Colonoscopy  Never done   COVID-19 Vaccine (4 - 2024-25 season) 07/02/2023   INFLUENZA VACCINE  05/31/2024   DTaP/Tdap/Td (2 - Td or Tdap) 12/15/2027   HPV VACCINES  Aged Out   Meningococcal B Vaccine  Aged Out      Lifestyle: Diet: balanced diet  Exercise: active in daily life  Tobacco Use:   Social History   Tobacco Use  Smoking Status Never  Smokeless Tobacco Never     Alcohol use:   Social History   Substance and Sexual Activity  Alcohol Use Yes   Alcohol/week: 2.0 standard drinks of alcohol   Types: 2 Cans of beer per week   Comment: Jul 2020      Current Vital signs:  Vitals:   02/13/24 0809  Weight: 174 lb (78.9 kg)  Height: 5\' 8"  (1.727 m)     Previous vital signs:     02/13/2024    8:09 AM  Vitals with BMI  Height 5\' 8"   Weight 174 lbs  BMI 26.46  Systolic 130  Diastolic 72      Past Medical History:  Diagnosis Date   HTN (hypertension)      Current Outpatient Medications on File Prior to Visit  Medication Sig  Dispense Refill   amLODipine (NORVASC) 5 MG tablet Take 1 tablet (5 mg total) by mouth daily. 90 tablet 3   No current facility-administered medications on file prior to visit.     ROS   Physical Exam Constitutional:      Appearance: Normal appearance. He is normal weight.  HENT:     Head: Normocephalic and atraumatic.     Right Ear: Tympanic membrane, ear canal and external ear normal. There is no impacted cerumen.     Left Ear: Tympanic membrane, ear canal and external ear normal. There is no impacted cerumen.     Nose: Nose normal.     Mouth/Throat:     Mouth: Mucous membranes are moist.     Pharynx: Oropharynx is clear.  Eyes:     Extraocular Movements: Extraocular movements intact.     Pupils: Pupils are equal, round, and reactive to light.  Cardiovascular:     Rate and Rhythm: Normal rate and regular rhythm.     Pulses: Normal pulses.     Heart sounds: Normal heart sounds.  Pulmonary:     Effort: Pulmonary effort is normal.     Breath sounds: Normal breath sounds.  Abdominal:     General: Abdomen is flat. Bowel sounds are normal.     Palpations: Abdomen is soft.     Tenderness: There is no right CVA tenderness or left CVA tenderness.  Musculoskeletal:        General: No tenderness. Normal range of motion.     Cervical back: Normal range of motion. No tenderness.  Skin:    General: Skin is warm and dry.  Neurological:     General: No focal deficit present.     Mental Status: He is alert and oriented to person, place, and time.     Sensory: No sensory deficit.     Motor: No weakness.  Psychiatric:        Mood and Affect: Mood normal.        Behavior: Behavior normal.      Assessment/Plan/Recommendation: Overall, he is doing well. No concerns today. Discussed lab results. Vital within normal  range.    Health Maintenance Due  Topic Date Due   HIV Screening  Never done   Hepatitis C Screening  Never done   Colonoscopy  Never done   COVID-19 Vaccine (4 -  2024-25 season) 07/02/2023

## 2024-03-26 ENCOUNTER — Ambulatory Visit: Payer: Self-pay | Admitting: Physician Assistant

## 2024-03-26 ENCOUNTER — Emergency Department
Admission: EM | Admit: 2024-03-26 | Discharge: 2024-03-26 | Disposition: A | Discharge status: Home / Self Care | Attending: Emergency Medicine | Admitting: Emergency Medicine

## 2024-03-26 ENCOUNTER — Encounter: Payer: Self-pay | Admitting: Physician Assistant

## 2024-03-26 ENCOUNTER — Other Ambulatory Visit: Payer: Self-pay

## 2024-03-26 ENCOUNTER — Telehealth: Payer: Self-pay

## 2024-03-26 VITALS — BP 140/87 | HR 63 | Resp 14 | Ht 67.0 in | Wt 174.0 lb

## 2024-03-26 DIAGNOSIS — K645 Perianal venous thrombosis: Secondary | ICD-10-CM | POA: Diagnosis present

## 2024-03-26 MED ORDER — TRAMADOL-ACETAMINOPHEN 37.5-325 MG PO TABS: 1.0000 | ORAL_TABLET | Freq: Four times a day (QID) | ORAL | 0 refills | Status: DC | PRN

## 2024-03-26 MED ORDER — DOCUSATE SODIUM 250 MG PO CAPS
250.0000 mg | ORAL_CAPSULE | Freq: Every day | ORAL | 0 refills | Status: AC
Start: 2024-03-26 — End: ?

## 2024-03-26 MED ORDER — HYDROCORT-PRAMOXINE (PERIANAL) 1-1 % EX FOAM: 1.0000 | Freq: Two times a day (BID) | CUTANEOUS | 1 refills | Status: DC

## 2024-03-26 MED ORDER — LIDOCAINE-EPINEPHRINE 2 %-1:100000 IJ SOLN
20.0000 mL | Freq: Once | INTRAMUSCULAR | Status: AC
  Administered 2024-03-26: 20 mL via INTRADERMAL
  Filled 2024-03-26: qty 1

## 2024-03-26 NOTE — Telephone Encounter (Signed)
 Michael Garrison called clinic after he was seen at [facility] ED.  States he called [facility] for follow-up as ED instructed him & they can't see him until August 2025.  Ron requested I send a referral to [facility].

## 2024-03-26 NOTE — Addendum Note (Signed)
 Addended by: Marcina Severe on: 03/26/2024 04:02 PM   Modules accepted: Orders

## 2024-03-26 NOTE — Discharge Instructions (Signed)
 Please continue to take your stool softeners.  Keep your bottom clean with soap and water.  Please follow-up with gastroenterology for the definitive treatment of your hemorrhoid.  Please return for any new, worsening, or change in symptoms or other concerns.  It was a pleasure caring for you today.

## 2024-03-26 NOTE — ED Provider Notes (Signed)
 [facility] Provider Note    Event Date/Time   First MD Initiated Contact with Patient 03/26/24 1106     (approximate)   History   Hemorrhoids   HPI  Michael Garrison is a 48 y.o. male who presents today for evaluation of hemorrhoids.  Patient reports that this just occurred yesterday and so he went to see his outpatient provider today and he sent him to the emergency department for a thrombosed hemorrhoid.  Patient reports that he has been having to strain to move his bowels.  He is requesting for this to be cut open.  No fevers or chills.  Patient Active Problem List   Diagnosis Date Noted   Chest pain at rest 10/07/2020   Essential hypertension 10/07/2020          Physical Exam   Triage Vital Signs: ED Triage Vitals  Encounter Vitals Group     BP 03/26/24 1101 (!) 135/96     Systolic BP Percentile --      Diastolic BP Percentile --      Pulse Rate 03/26/24 1101 (!) 51     Resp 03/26/24 1101 18     Temp 03/26/24 1101 98 F (36.7 C)     Temp src --      SpO2 03/26/24 1101 100 %     Weight 03/26/24 1100 174 lb (78.9 kg)     Height 03/26/24 1100 5\' 7"  (1.702 m)     Head Circumference --      Peak Flow --      Pain Score 03/26/24 1100 9     Pain Loc --      Pain Education --      Exclude from Growth Chart --     Most recent vital signs: Vitals:   03/26/24 1101  BP: (!) 135/96  Pulse: (!) 51  Resp: 18  Temp: 98 F (36.7 C)  SpO2: 100%    Physical Exam Vitals and nursing note reviewed.  Constitutional:      General: Awake and alert. No acute distress.    Appearance: Normal appearance. The patient is normal weight.  HENT:     Head: Normocephalic and atraumatic.     Mouth: Mucous membranes are moist.  Eyes:     General: PERRL. Normal EOMs        Right eye: No discharge.        Left eye: No discharge.     Conjunctiva/sclera: Conjunctivae normal.  Cardiovascular:     Rate and Rhythm: Normal rate and regular rhythm.      Pulses: Normal pulses.  Pulmonary:     Effort: Pulmonary effort is normal. No respiratory distress.     Breath sounds: Normal breath sounds.  Abdominal:     Abdomen is soft. There is no abdominal tenderness. No rebound or guarding. No distention. Rectal: 2 x 2 cm thrombosed hemorrhoid.  No evidence of abscess.  No surrounding erythema.  Tender to palpate. Musculoskeletal:        General: No swelling. Normal range of motion.     Cervical back: Normal range of motion and neck supple.  Skin:    General: Skin is warm and dry.     Capillary Refill: Capillary refill takes less than 2 seconds.     Findings: No rash.  Neurological:     Mental Status: The patient is awake and alert.      ED Results / Procedures / Treatments   Labs (all labs  ordered are listed, but only abnormal results are displayed) Labs Reviewed - No data to display   EKG     RADIOLOGY     PROCEDURES:  Critical Care performed:   .Incision and Drainage  Date/Time: 03/26/2024 3:56 PM  Performed by: Jacob Cicero E, PA-C Authorized by: Aniel Hubble E, PA-C   Consent:    Consent obtained:  Verbal   Consent given by:  Patient   Risks, benefits, and alternatives were discussed: yes     Risks discussed:  Bleeding, damage to other organs, incomplete drainage, infection and pain   Alternatives discussed:  No treatment Universal protocol:    Procedure explained and questions answered to patient or proxy's satisfaction: yes     Relevant documents present and verified: yes     Test results available : yes     Required blood products, implants, devices, and special equipment available: yes     Site/side marked: yes     Immediately prior to procedure, a time out was called: yes     Patient identity confirmed:  Verbally with patient Location:    Type:  External thrombosed hemorrhoid   Size:  2x2cm   Location:  Anogenital   Anogenital location:  Rectum Pre-procedure details:    Skin preparation:   Povidone-iodine Sedation:    Sedation type:  None Anesthesia:    Anesthesia method:  Local infiltration   Local anesthetic:  Lidocaine 2% WITH epi Procedure type:    Complexity:  Complex Procedure details:    Ultrasound guidance: no     Needle aspiration: no     Incision types:  Elliptical   Incision depth:  Dermal   Wound management:  Probed and deloculated   Drainage:  Bloody (clots)   Drainage amount:  Moderate   Wound treatment:  Wound left open   Packing materials:  None Post-procedure details:    Procedure completion:  Tolerated well, no immediate complications    MEDICATIONS ORDERED IN ED: Medications  lidocaine-EPINEPHrine (XYLOCAINE W/EPI) 2 %-1:100000 (with pres) injection 20 mL (20 mLs Intradermal Given by Other 03/26/24 1151)     IMPRESSION / MDM / ASSESSMENT AND PLAN / ED COURSE  I reviewed the triage vital signs and the nursing notes.   Differential diagnosis includes, but is not limited to, thrombosed hemorrhoid, abscess, hematoma.  I reviewed the patient's chart.  Patient was seen at outpatient clinic today.  He has tried ibuprofen and suppositories.  Exam is consistent with thrombosed hemorrhoid.  Discussed the option of incision and drainage versus topical treatment, patient prefers incision and drainage.  This was performed with good effect, clots were extracted and pain was significantly improved.  I recommended that he follow-up with gastroenterology for definitive management such as banding.  We discussed return precautions in the meantime.  Patient has stool softeners and I encouraged him to continue to use these.  We discussed return precautions.  Patient understands and agrees with plan.  He was discharged in stable condition.  Patient's presentation is most consistent with acute illness / injury with system symptoms.    FINAL CLINICAL IMPRESSION(S) / ED DIAGNOSES   Final diagnoses:  Thrombosed external hemorrhoid     Rx / DC Orders   ED  Discharge Orders     None        Note:  This document was prepared using Dragon voice recognition software and may include unintentional dictation errors.   Onyx Schirmer E, PA-C 03/26/24 1558    Kandee Orion,  MD 03/27/24 2307

## 2024-03-26 NOTE — ED Triage Notes (Signed)
 Pt comes with c/o hemorrhoids for three days. Pt states no relief with meds.

## 2024-03-26 NOTE — Progress Notes (Signed)
 Called stating he's had external hemorroids since Sunday. Has been using OTC Ibuprofen, cream & suppository Called out of work today because he's so uncomfortable. Has not been treated at clinic for hemorroids - states OTC meds usually work. Different this time:  Hemorroid seems bigger than before  No improvement with OTC medications  Hurts more than before  Reviewed above info with Tery Fielding, PA-C Requested Constantino Demark to come to clinc - Appt scheduled for today at 10:15 am

## 2024-03-26 NOTE — Progress Notes (Addendum)
   Subjective: Rectal hemorrhoid    Patient ID: Michael Garrison, male    DOB: [birth year], 48 y.o.   MRN: [id number]  HPI Patient presents with protruding firm rectal hemorrhoid.  Patient has history of rectal hemorrhoids but normally resolves with over-the-counter medications.  Patient states in the last 2 days the itching has gotten larger and harder.  Patient relates discomfort with sitting.  No relief with over-the-counter hemorrhoidal medications.   Review of Systems     Objective:   Physical Exam BP 140/87BP. 140/87. Data is abnormal. Taken on 03/26/24 10:20 AM  Patient Position StandingPatient Position. Standing. Has comment. Taken on 03/26/24 10:20 AM  Cuff Size Normal  Pulse Rate 63  Weight 174 lb (78.9 kg)  Height 5\' 7"  (1.702 m)  Resp 14  SpO2 98 %  Physical exam reveals approximately 3 cm thrombosed external hemorrhoidal tissue.       Assessment & Plan: Thrombosed hemorrhoid  Patient advised to report to the emergency departmentFor definitive evaluation and treatment.  Patient calls at 3:58 PM today stating he is finished treatment at the emergency room consisting of incision and removal of blood clots or tissue.  Patient stated numbing medicine has worn off he is having discomfort.  Patient cough gastroenterology was told no problems until August 2025.  Advised patient I will send a prescription for Proctofoam and Ultracet.  Will try to find another group gastroenterology appointment

## 2024-03-27 ENCOUNTER — Ambulatory Visit: Payer: Self-pay | Admitting: Surgery

## 2024-03-27 NOTE — H&P (Signed)
 Subjective:  CC: Fourth degree hemorrhoids [K64.3]   HPI:  Michael Garrison is a 48 y.o. male who was referrred by Shoshana Dowse Sr* for above. Symptoms were first noted a few days ago. S/p clot removal in ED a one day ago, still having bleeding and swelling.   Past Medical History:  has no past medical history on file.  Past Surgical History:  has no past surgical history on file.  Family History: family history is not on file.  Social History:  reports that he has never smoked. He has never used smokeless tobacco. No history on file for alcohol use and drug use.  Current Medications: has a current medication list which includes the following prescription(s): amlodipine , docusate, hydrocortisone-pramoxine, and naproxen .  Allergies:  Allergies as of 03/27/2024   (No Known Allergies)    ROS:  A 15 point review of systems was performed and pertinent positives and negatives noted in HPI  Objective:     BP 118/68   Pulse 58   Ht 170.2 cm (5\' 7" )   Wt 78.9 kg (174 lb)   BMI 27.25 kg/m   Constitutional :  No distress, cooperative, alert  Lymphatics/Throat:  Supple with no lymphadenopathy  Respiratory:  Clear to auscultation bilaterally  Cardiovascular:  Regular rate and rhythm  Gastrointestinal: Soft, non-tender, non-distended, no organomegaly.  Musculoskeletal: Steady gait and movement  Skin: Cool and moist  Psychiatric: Normal affect, non-agitated, not confused  Rectal: Very large thrombose hemorrhoid, still with obvious clots within      LABS:  N/a   RADS: N/a Assessment:     Fourth degree hemorrhoids [K64.3] thrombosed.  Due to size and persistent symptoms, recommended removal in OR. Plan:    1. Fourth degree hemorrhoids [K64.3] Discussed risks/benefits/alternatives to surgery.  Alternatives include the options of observation, medical management.  Benefits include symptomatic relief.  I discussed  in detail and the complications related to the operation and  the anesthesia, including bleeding, infection, recurrence, remote possibility of temporary or permanent fecal incontinence, poor/delayed wound healing, chronic pain, and additional procedures to address said risks. The risks of general anesthetic, if used, includes MI, CVA, sudden death or even reaction to anesthetic medications also discussed.   We also discussed typical post operative recovery which includes weeks to potentially months of anal pain, drainage, occasional bleeding, and sense of fecal urgency.    ED return precautions given for sudden increase in pain, bleeding, with possible accompanying fever, nausea, and/or vomiting.  The patient understands the risks, any and all questions were answered to the patient's satisfaction.  2. Patient has elected to proceed with surgical treatment. Procedure will be scheduled.  labs/images/medications/previous chart entries reviewed personally and relevant changes/updates noted above.

## 2024-03-27 NOTE — H&P (View-Only) (Signed)
 Subjective:  CC: Fourth degree hemorrhoids [K64.3]   HPI:  Michael Garrison is a 48 y.o. male who was referrred by Shoshana Dowse Sr* for above. Symptoms were first noted a few days ago. S/p clot removal in ED a one day ago, still having bleeding and swelling.   Past Medical History:  has no past medical history on file.  Past Surgical History:  has no past surgical history on file.  Family History: family history is not on file.  Social History:  reports that he has never smoked. He has never used smokeless tobacco. No history on file for alcohol use and drug use.  Current Medications: has a current medication list which includes the following prescription(s): amlodipine , docusate, hydrocortisone-pramoxine, and naproxen .  Allergies:  Allergies as of 03/27/2024   (No Known Allergies)    ROS:  A 15 point review of systems was performed and pertinent positives and negatives noted in HPI  Objective:     BP 118/68   Pulse 58   Ht 170.2 cm (5\' 7" )   Wt 78.9 kg (174 lb)   BMI 27.25 kg/m   Constitutional :  No distress, cooperative, alert  Lymphatics/Throat:  Supple with no lymphadenopathy  Respiratory:  Clear to auscultation bilaterally  Cardiovascular:  Regular rate and rhythm  Gastrointestinal: Soft, non-tender, non-distended, no organomegaly.  Musculoskeletal: Steady gait and movement  Skin: Cool and moist  Psychiatric: Normal affect, non-agitated, not confused  Rectal: Very large thrombose hemorrhoid, still with obvious clots within      LABS:  N/a   RADS: N/a Assessment:     Fourth degree hemorrhoids [K64.3] thrombosed.  Due to size and persistent symptoms, recommended removal in OR. Plan:    1. Fourth degree hemorrhoids [K64.3] Discussed risks/benefits/alternatives to surgery.  Alternatives include the options of observation, medical management.  Benefits include symptomatic relief.  I discussed  in detail and the complications related to the operation and  the anesthesia, including bleeding, infection, recurrence, remote possibility of temporary or permanent fecal incontinence, poor/delayed wound healing, chronic pain, and additional procedures to address said risks. The risks of general anesthetic, if used, includes MI, CVA, sudden death or even reaction to anesthetic medications also discussed.   We also discussed typical post operative recovery which includes weeks to potentially months of anal pain, drainage, occasional bleeding, and sense of fecal urgency.    ED return precautions given for sudden increase in pain, bleeding, with possible accompanying fever, nausea, and/or vomiting.  The patient understands the risks, any and all questions were answered to the patient's satisfaction.  2. Patient has elected to proceed with surgical treatment. Procedure will be scheduled.  labs/images/medications/previous chart entries reviewed personally and relevant changes/updates noted above.

## 2024-03-28 ENCOUNTER — Ambulatory Visit
Admission: RE | Admit: 2024-03-28 | Discharge: 2024-03-28 | Disposition: A | Discharge status: Home / Self Care | Attending: Surgery | Admitting: Surgery

## 2024-03-28 ENCOUNTER — Ambulatory Visit: Admitting: Anesthesiology

## 2024-03-28 ENCOUNTER — Encounter: Payer: Self-pay | Admitting: Surgery

## 2024-03-28 ENCOUNTER — Encounter
Admission: RE | Disposition: A | Discharge status: Home / Self Care | Payer: Self-pay | Source: Home / Self Care | Attending: Surgery

## 2024-03-28 ENCOUNTER — Other Ambulatory Visit: Payer: Self-pay

## 2024-03-28 DIAGNOSIS — K643 Fourth degree hemorrhoids: Secondary | ICD-10-CM

## 2024-03-28 DIAGNOSIS — Z79899 Other long term (current) drug therapy: Secondary | ICD-10-CM | POA: Insufficient documentation

## 2024-03-28 DIAGNOSIS — I10 Essential (primary) hypertension: Secondary | ICD-10-CM

## 2024-03-28 DIAGNOSIS — G8929 Other chronic pain: Secondary | ICD-10-CM | POA: Diagnosis not present

## 2024-03-28 HISTORY — PX: HEMORRHOID SURGERY: SHX153

## 2024-03-28 SURGERY — HEMORRHOIDECTOMY
Anesthesia: General | Site: Rectum

## 2024-03-28 MED ORDER — DEXAMETHASONE SODIUM PHOSPHATE 10 MG/ML IJ SOLN
INTRAMUSCULAR | Status: DC | PRN
  Administered 2024-03-28: 10 mg via INTRAVENOUS

## 2024-03-28 MED ORDER — FENTANYL CITRATE (PF) 100 MCG/2ML IJ SOLN
INTRAMUSCULAR | Status: AC
  Filled 2024-03-28: qty 2

## 2024-03-28 MED ORDER — CHLORHEXIDINE GLUCONATE 0.12 % MT SOLN
OROMUCOSAL | Status: AC
  Filled 2024-03-28: qty 15

## 2024-03-28 MED ORDER — LIDOCAINE HCL (PF) 2 % IJ SOLN
INTRAMUSCULAR | Status: AC
  Filled 2024-03-28: qty 5

## 2024-03-28 MED ORDER — FENTANYL CITRATE (PF) 100 MCG/2ML IJ SOLN
INTRAMUSCULAR | Status: DC | PRN
Start: 2024-03-28 — End: 2024-03-28
  Administered 2024-03-28 (×2): 50 ug via INTRAVENOUS

## 2024-03-28 MED ORDER — CEFAZOLIN SODIUM-DEXTROSE 2-4 GM/100ML-% IV SOLN
INTRAVENOUS | Status: AC
  Filled 2024-03-28: qty 100

## 2024-03-28 MED ORDER — OXYCODONE HCL 5 MG PO TABS: 5.0000 mg | ORAL_TABLET | Freq: Once | ORAL | Status: DC | PRN

## 2024-03-28 MED ORDER — BUPIVACAINE LIPOSOME 1.3 % IJ SUSP
INTRAMUSCULAR | Status: AC
  Filled 2024-03-28: qty 20

## 2024-03-28 MED ORDER — LACTATED RINGERS IV SOLN: INTRAVENOUS | Status: DC

## 2024-03-28 MED ORDER — ORAL CARE MOUTH RINSE: 15.0000 mL | Freq: Once | OROMUCOSAL | Status: AC

## 2024-03-28 MED ORDER — IBUPROFEN 800 MG PO TABS: 800.0000 mg | ORAL_TABLET | Freq: Three times a day (TID) | ORAL | 0 refills | Status: AC | PRN

## 2024-03-28 MED ORDER — GABAPENTIN 300 MG PO CAPS
ORAL_CAPSULE | ORAL | Status: AC
  Filled 2024-03-28: qty 1

## 2024-03-28 MED ORDER — ACETAMINOPHEN 500 MG PO TABS
1000.0000 mg | ORAL_TABLET | ORAL | Status: AC
  Administered 2024-03-28: 1000 mg via ORAL

## 2024-03-28 MED ORDER — LIDOCAINE HCL (CARDIAC) PF 100 MG/5ML IV SOSY
PREFILLED_SYRINGE | INTRAVENOUS | Status: DC | PRN
  Administered 2024-03-28: 100 mg via INTRAVENOUS

## 2024-03-28 MED ORDER — LIDOCAINE 5 % EX OINT
1.0000 | TOPICAL_OINTMENT | Freq: Three times a day (TID) | CUTANEOUS | 0 refills | Status: AC | PRN
Start: 2024-03-28 — End: ?

## 2024-03-28 MED ORDER — CELECOXIB 200 MG PO CAPS
ORAL_CAPSULE | ORAL | Status: AC
  Filled 2024-03-28: qty 1

## 2024-03-28 MED ORDER — EPHEDRINE SULFATE-NACL 50-0.9 MG/10ML-% IV SOSY
PREFILLED_SYRINGE | INTRAVENOUS | Status: DC | PRN
Start: 2024-03-28 — End: 2024-03-28
  Administered 2024-03-28: 10 mg via INTRAVENOUS

## 2024-03-28 MED ORDER — GLYCOPYRROLATE 0.2 MG/ML IJ SOLN
INTRAMUSCULAR | Status: AC
  Filled 2024-03-28: qty 1

## 2024-03-28 MED ORDER — EPHEDRINE 5 MG/ML INJ
INTRAVENOUS | Status: AC
  Filled 2024-03-28: qty 5

## 2024-03-28 MED ORDER — GABAPENTIN 300 MG PO CAPS
300.0000 mg | ORAL_CAPSULE | ORAL | Status: AC
  Administered 2024-03-28: 300 mg via ORAL

## 2024-03-28 MED ORDER — BUPIVACAINE-EPINEPHRINE (PF) 0.5% -1:200000 IJ SOLN
INTRAMUSCULAR | Status: AC
  Filled 2024-03-28: qty 30

## 2024-03-28 MED ORDER — ONDANSETRON HCL 4 MG/2ML IJ SOLN
INTRAMUSCULAR | Status: DC | PRN
  Administered 2024-03-28: 4 mg via INTRAVENOUS

## 2024-03-28 MED ORDER — FENTANYL CITRATE (PF) 100 MCG/2ML IJ SOLN: 25.0000 ug | INTRAMUSCULAR | Status: DC | PRN

## 2024-03-28 MED ORDER — BUPIVACAINE LIPOSOME 1.3 % IJ SUSP
INTRAMUSCULAR | Status: DC | PRN
Start: 2024-03-28 — End: 2024-03-28
  Administered 2024-03-28: 20 mL

## 2024-03-28 MED ORDER — OXYCODONE-ACETAMINOPHEN 5-325 MG PO TABS: 1.0000 | ORAL_TABLET | Freq: Three times a day (TID) | ORAL | 0 refills | Status: AC | PRN

## 2024-03-28 MED ORDER — ACETAMINOPHEN 500 MG PO TABS
ORAL_TABLET | ORAL | Status: AC
  Filled 2024-03-28: qty 2

## 2024-03-28 MED ORDER — CEFAZOLIN SODIUM-DEXTROSE 2-4 GM/100ML-% IV SOLN
2.0000 g | INTRAVENOUS | Status: AC
  Administered 2024-03-28: 2 g via INTRAVENOUS

## 2024-03-28 MED ORDER — MIDAZOLAM HCL 2 MG/2ML IJ SOLN
INTRAMUSCULAR | Status: DC | PRN
  Administered 2024-03-28: 2 mg via INTRAVENOUS

## 2024-03-28 MED ORDER — PROPOFOL 10 MG/ML IV BOLUS
INTRAVENOUS | Status: DC | PRN
  Administered 2024-03-28: 200 mg via INTRAVENOUS

## 2024-03-28 MED ORDER — CHLORHEXIDINE GLUCONATE 0.12 % MT SOLN
15.0000 mL | Freq: Once | OROMUCOSAL | Status: AC
  Administered 2024-03-28: 15 mL via OROMUCOSAL

## 2024-03-28 MED ORDER — CELECOXIB 200 MG PO CAPS
200.0000 mg | ORAL_CAPSULE | ORAL | Status: AC
  Administered 2024-03-28: 200 mg via ORAL

## 2024-03-28 MED ORDER — ACETAMINOPHEN 325 MG PO TABS: 650.0000 mg | ORAL_TABLET | Freq: Three times a day (TID) | ORAL | 0 refills | Status: AC | PRN

## 2024-03-28 MED ORDER — ACETAMINOPHEN 10 MG/ML IV SOLN: 1000.0000 mg | Freq: Once | INTRAVENOUS | Status: DC | PRN

## 2024-03-28 MED ORDER — GELATIN ABSORBABLE 12-7 MM EX MISC
CUTANEOUS | Status: AC
  Filled 2024-03-28: qty 1

## 2024-03-28 MED ORDER — DOCUSATE SODIUM 100 MG PO CAPS: 100.0000 mg | ORAL_CAPSULE | Freq: Two times a day (BID) | ORAL | 0 refills | Status: AC | PRN

## 2024-03-28 MED ORDER — DEXAMETHASONE SODIUM PHOSPHATE 10 MG/ML IJ SOLN
INTRAMUSCULAR | Status: AC
  Filled 2024-03-28: qty 1

## 2024-03-28 MED ORDER — GELATIN ABSORBABLE 12-7 MM EX MISC
CUTANEOUS | Status: DC | PRN
Start: 2024-03-28 — End: 2024-03-28
  Administered 2024-03-28: 1

## 2024-03-28 MED ORDER — OXYCODONE HCL 5 MG/5ML PO SOLN: 5.0000 mg | Freq: Once | ORAL | Status: DC | PRN

## 2024-03-28 MED ORDER — ONDANSETRON HCL 4 MG/2ML IJ SOLN
INTRAMUSCULAR | Status: AC
  Filled 2024-03-28: qty 2

## 2024-03-28 MED ORDER — CHLORHEXIDINE GLUCONATE CLOTH 2 % EX PADS: 6.0000 | MEDICATED_PAD | Freq: Once | CUTANEOUS | Status: DC

## 2024-03-28 MED ORDER — PROPOFOL 10 MG/ML IV BOLUS
INTRAVENOUS | Status: AC
  Filled 2024-03-28: qty 20

## 2024-03-28 MED ORDER — MIDAZOLAM HCL 2 MG/2ML IJ SOLN
INTRAMUSCULAR | Status: AC
  Filled 2024-03-28: qty 2

## 2024-03-28 MED ORDER — 0.9 % SODIUM CHLORIDE (POUR BTL) OPTIME
TOPICAL | Status: DC | PRN
Start: 2024-03-28 — End: 2024-03-28
  Administered 2024-03-28: 500 mL

## 2024-03-28 MED ORDER — ONDANSETRON HCL 4 MG/2ML IJ SOLN: 4.0000 mg | Freq: Once | INTRAMUSCULAR | Status: DC | PRN

## 2024-03-28 MED ORDER — BUPIVACAINE-EPINEPHRINE (PF) 0.5% -1:200000 IJ SOLN
INTRAMUSCULAR | Status: DC | PRN
Start: 2024-03-28 — End: 2024-03-28
  Administered 2024-03-28: 15 mL via PERINEURAL

## 2024-03-28 MED ORDER — GLYCOPYRROLATE 0.2 MG/ML IJ SOLN
INTRAMUSCULAR | Status: DC | PRN
  Administered 2024-03-28: .2 mg via INTRAVENOUS

## 2024-03-28 SURGICAL SUPPLY — 27 items
BLADE SURG 15 STRL LF DISP TIS (BLADE) ×1 IMPLANT
BRIEF MESH DISP 2XL (UNDERPADS AND DIAPERS) ×1 IMPLANT
DRAPE PERI LITHO V/GYN (MISCELLANEOUS) ×1 IMPLANT
DRAPE UNDER BUTTOCK W/FLU (DRAPES) ×1 IMPLANT
DRSG GAUZE FLUFF 36X18 (GAUZE/BANDAGES/DRESSINGS) ×1 IMPLANT
ELECTRODE REM PT RTRN 9FT ADLT (ELECTROSURGICAL) ×1 IMPLANT
GAUZE 4X4 16PLY ~~LOC~~+RFID DBL (SPONGE) IMPLANT
GLOVE BIOGEL PI IND STRL 7.0 (GLOVE) ×1 IMPLANT
GLOVE SURG SYN 6.5 ES PF (GLOVE) ×3 IMPLANT
GLOVE SURG SYN 6.5 PF PI (GLOVE) ×3 IMPLANT
GOWN STRL REUS W/ TWL LRG LVL3 (GOWN DISPOSABLE) ×3 IMPLANT
KIT TURNOVER CYSTO (KITS) ×1 IMPLANT
LABEL OR SOLS (LABEL) ×1 IMPLANT
MANIFOLD NEPTUNE II (INSTRUMENTS) ×1 IMPLANT
NDL HYPO 22X1.5 SAFETY MO (MISCELLANEOUS) ×1 IMPLANT
NEEDLE HYPO 22X1.5 SAFETY MO (MISCELLANEOUS) ×1 IMPLANT
NS IRRIG 500ML POUR BTL (IV SOLUTION) ×1 IMPLANT
PACK BASIN MINOR ARMC (MISCELLANEOUS) ×1 IMPLANT
PAD PREP OB/GYN DISP 24X41 (PERSONAL CARE ITEMS) ×1 IMPLANT
SHEARS HARMONIC 9CM CVD (BLADE) IMPLANT
SOLUTION PREP PVP 2OZ (MISCELLANEOUS) ×1 IMPLANT
SURGILUBE 2OZ TUBE FLIPTOP (MISCELLANEOUS) ×1 IMPLANT
SUT VIC AB 3-0 SH 27X BRD (SUTURE) ×1 IMPLANT
SWAB CULTURE AMIES ANAERIB BLU (MISCELLANEOUS) IMPLANT
SYR 10ML LL (SYRINGE) ×1 IMPLANT
SYR 20ML LL LF (SYRINGE) ×1 IMPLANT
TRAP FLUID SMOKE EVACUATOR (MISCELLANEOUS) ×1 IMPLANT

## 2024-03-28 NOTE — Interval H&P Note (Signed)
 History and Physical Interval Note:  03/28/2024 1:45 PM  Michael Garrison  has presented today for surgery, with the diagnosis of K64.3  Hemorrhoids.  The various methods of treatment have been discussed with the patient and family. After consideration of risks, benefits and other options for treatment, the patient has consented to  Procedure(s): HEMORRHOIDECTOMY (N/A) as a surgical intervention.  The patient's history has been reviewed, patient examined, no change in status, stable for surgery.  I have reviewed the patient's chart and labs.  Questions were answered to the patient's satisfaction.     Shonica Weier Rosea [name]

## 2024-03-28 NOTE — Op Note (Signed)
 Preoperative diagnosis: Fourth degree hemorrhoids.   Postoperative diagnosis: Fourth degree hemorrhoids.  Procedure: exam under anesthesia, 2 column hemorrhoidectomy.  Surgeon: Rosea [name]  Anesthesia: general  Specimen: hemorrhoids x2  Complications: none  EBL: 15mL  Wound classification: Clean Contaminated  Indications: Patient is a 48 y.o. male was found to have symptomatic hemorrhoids refractory to medical management.   Findings: 1.  Fourth degree thrombosed hemorrhoids right posterior and right anterior column 2. Internal and external anal sphincter palpated and preserved 3. Adequate hemostasis  Description of procedure: The patient was brought to the operating room and general anesthesia was induced. Patient was placed high lithotomy position. A time-out was completed verifying correct patient, procedure, site, positioning, and implant(s) and/or special equipment prior to beginning this procedure. The perineum was prepped and draped in standard sterile fashion. Local anesthetic was injected as a perianal block.   A harmonic was placed across the base of the RA and RP pedicles at the same time due to the severely congested vessels conglomerated into a larger thrombosed hemorrhoid and excess hemorrhoidal tissue removed.  Specimen was passed off operative field pending pathology.  3-0 Vicryl then used to close the open wound.  Hemostasis achieved with additional 3-0 vicryl suture in areas of bleeding until all active bleeding controlled.  Last inspection of the anal canal did not note any additional hemorrhoidal tissue and no other pathology. Exparel injected as a perianal block. A gauze pad was tucked between the gluteal folds, and secured in place with mesh underwear.  The patient tolerated the procedure well and was taken to the postanesthesia care unit in stable condition.  Sponge and instrument count correct at end of procedure.

## 2024-03-28 NOTE — Transfer of Care (Signed)
 Immediate Anesthesia Transfer of Care Note  Patient: Michael Garrison  Procedure(s) Performed: HEMORRHOIDECTOMY (Rectum)  Patient Location: PACU  Anesthesia Type:General  Level of Consciousness: drowsy  Airway & Oxygen Therapy: Patient Spontanous Breathing and Patient connected to face mask oxygen  Post-op Assessment: Report given to RN and Post -op Vital signs reviewed and stable  Post vital signs: Reviewed and stable  Last Vitals:  Vitals Value Taken Time  BP 113/61 03/28/24 1439  Temp 36.3 C 03/28/24 1439  Pulse 77 03/28/24 1440  Resp 11 03/28/24 1440  SpO2 100 % 03/28/24 1440  Vitals shown include unfiled device data.  Last Pain:  Vitals:   03/28/24 1042  TempSrc: Temporal  PainSc: 6          Complications: No notable events documented.

## 2024-03-28 NOTE — Anesthesia Procedure Notes (Signed)
 Procedure Name: LMA Insertion Date/Time: 03/28/2024 1:59 PM  Performed by: Ellwood Haber, CRNAPre-anesthesia Checklist: Patient identified, Patient being monitored, Timeout performed, Emergency Drugs available and Suction available Patient Re-evaluated:Patient Re-evaluated prior to induction Oxygen Delivery Method: Circle system utilized Preoxygenation: Pre-oxygenation with 100% oxygen Induction Type: IV induction Ventilation: Mask ventilation without difficulty LMA: LMA inserted LMA Size: 4.0 Tube type: Oral Number of attempts: 1 Placement Confirmation: positive ETCO2 and breath sounds checked- equal and bilateral Tube secured with: Tape Dental Injury: Teeth and Oropharynx as per pre-operative assessment  Comments: Smooth atraumatic placement of LMA, no complications noted

## 2024-03-28 NOTE — Discharge Instructions (Signed)
 Hemorrhoids, Care After This sheet gives you information about how to care for yourself after your procedure. Your health care provider may also give you more specific instructions. If you have problems or questions, contact your health care provider. What can I expect after the procedure? After the procedure, it is common to have: Soreness. Bruising. Itching.  Follow these instructions at home: site care Follow instructions from your health care provider about how to take care of your site. Make sure you: LEAVE packing in place until it falls out on its own.  No need to replace afterwards Leave stitches (sutures), skin glue, or adhesive strips in place.  If the area bleeds or bruises, apply gentle pressure for 10 minutes. OK TO SHOWER IN 24HRS  General instructions Rest and then return to your normal activities as told by your health care provider. tylenol and advil as needed for discomfort.  Please alternate between the two every four hours as needed for pain.    Use narcotics, if prescribed, only when tylenol and motrin is not enough to control pain.  325-650mg  every 8hrs to max of 3000mg /24hrs (including the 325mg  in every norco dose) for the tylenol.    Advil up to 800mg  per dose every 8hrs as needed for pain.   Keep all follow-up visits as told by your health care provider. This is important. Contact a health care provider if you have excessive: redness, swelling, or pain around your site. blood coming from your site. pus or a bad smell coming from your site. You have a fever.  Get help right away if: You have bleeding that does not stop with pressure or a dressing. Summary After the procedure, it is common to have some soreness, bruising, and itching at the site. Follow instructions from your health care provider about how to take care of your site. Keep all follow-up visits as told by your health care provider. This is important. This information is not intended to replace  advice given to you by your health care provider. Make sure you discuss any questions you have with your health care provider. Document Released: 11/13/2015 Document Revised: 04/16/2018 Document Reviewed: 04/16/2018 Elsevier Interactive Patient Education  [employer].

## 2024-03-28 NOTE — Anesthesia Preprocedure Evaluation (Addendum)
 Anesthesia Evaluation  Patient identified by MRN, date of birth, ID band Patient awake    Reviewed: Allergy & Precautions, NPO status , Patient's Chart, lab work & pertinent test results  History of Anesthesia Complications Negative for: history of anesthetic complications  Airway Mallampati: III   Neck ROM: Full    Dental no notable dental hx.    Pulmonary neg pulmonary ROS   Pulmonary exam normal breath sounds clear to auscultation       Cardiovascular hypertension, Normal cardiovascular exam Rhythm:Regular Rate:Normal  ECG 02/13/24: Sinus Rhythm  WITHIN NORMAL LIMITS   Neuro/Psych Chronic pain    GI/Hepatic negative GI ROS,,,  Endo/Other  negative endocrine ROS    Renal/GU negative Renal ROS     Musculoskeletal   Abdominal   Peds  Hematology negative hematology ROS (+)   Anesthesia Other Findings   Reproductive/Obstetrics                             Anesthesia Physical Anesthesia Plan  ASA: 2  Anesthesia Plan: General   Post-op Pain Management:    Induction: Intravenous  PONV Risk Score and Plan: 2 and Ondansetron, Dexamethasone and Treatment may vary due to age or medical condition  Airway Management Planned: LMA  Additional Equipment:   Intra-op Plan:   Post-operative Plan: Extubation in OR  Informed Consent: I have reviewed the patients History and Physical, chart, labs and discussed the procedure including the risks, benefits and alternatives for the proposed anesthesia with the patient or authorized representative who has indicated his/her understanding and acceptance.     Dental advisory given  Plan Discussed with: CRNA  Anesthesia Plan Comments: (Patient consented for risks of anesthesia including but not limited to:  - adverse reactions to medications - damage to eyes, teeth, lips or other oral mucosa - nerve damage due to positioning  - sore throat or  hoarseness - damage to heart, brain, nerves, lungs, other parts of body or loss of life  Informed patient about role of CRNA in peri- and intra-operative care.  Patient voiced understanding.)       Anesthesia Quick Evaluation

## 2024-03-29 ENCOUNTER — Encounter: Payer: Self-pay | Admitting: Surgery

## 2024-03-29 LAB — SURGICAL PATHOLOGY

## 2024-03-31 NOTE — Anesthesia Postprocedure Evaluation (Signed)
 Anesthesia Post Note  Patient: Michael Garrison  Procedure(s) Performed: HEMORRHOIDECTOMY (Rectum)  Patient location during evaluation: PACU Anesthesia Type: General Level of consciousness: awake and alert Pain management: pain level controlled Vital Signs Assessment: post-procedure vital signs reviewed and stable Respiratory status: spontaneous breathing, nonlabored ventilation, respiratory function stable and patient connected to nasal cannula oxygen Cardiovascular status: blood pressure returned to baseline and stable Postop Assessment: no apparent nausea or vomiting Anesthetic complications: no   No notable events documented.   Last Vitals:  Vitals:   03/28/24 1515 03/28/24 1531  BP: (!) 149/85 (!) 140/75  Pulse: 89 78  Resp: 16   Temp: (!) 36.2 C 36.6 C  SpO2: 99% 99%    Last Pain:  Vitals:   03/28/24 1531  TempSrc: Temporal  PainSc: 0-No pain                 Vanice Genre

## 2024-05-06 NOTE — Progress Notes (Signed)
 Subjective:   CC: Fourth degree hemorrhoids [K64.3] POSTOP  HPI: History of Present Illness No issues.   Current Medications: has a current medication list which includes the following prescription(s): amlodipine , docusate, multivitamin with minerals, naproxen , and hydrocortisone -pramoxine.  Allergies:  No Known Allergies  ROS: Pertinent positives and negatives noted in HPI    Objective:     BP 120/82   Pulse 67   Ht 172.7 cm (5' 8)   Wt 79.4 kg (175 lb)   BMI 26.61 kg/m   Constitutional :  Alert, no distress, cooperative  Gastrointestinal: soft, non-tender; bowel sounds normal; no masses,  no organomegaly.   Musculoskeletal: Steady gait and movement  Skin: Cool and moist, incisions clean, dry, intact.  No erythema, induration or drainage to indicate infection.    Psychiatric: Normal affect, non-agitated, not confused       LABS:  N/A   RADS: N/A  Assessment:      Fourth degree hemorrhoids [K64.3] S/p hemorrhoidectomy  Plan:     1. Healing well.  No issues.  Wound eventually should heal.  No further restrictions needed. All questions addressed. F/u prn.  labs/images/medications/previous chart entries reviewed personally and relevant changes/updates noted above.

## 2024-05-22 ENCOUNTER — Other Ambulatory Visit: Payer: Self-pay | Admitting: Physician Assistant

## 2024-05-22 DIAGNOSIS — I10 Essential (primary) hypertension: Secondary | ICD-10-CM

## 2024-09-20 ENCOUNTER — Ambulatory Visit: Payer: Self-pay

## 2024-09-20 DIAGNOSIS — Z23 Encounter for immunization: Secondary | ICD-10-CM

## 2024-09-23 ENCOUNTER — Ambulatory Visit: Payer: Self-pay | Admitting: Physician Assistant

## 2024-09-23 ENCOUNTER — Encounter: Payer: Self-pay | Admitting: Physician Assistant

## 2024-09-23 VITALS — BP 145/85 | HR 62

## 2024-09-23 DIAGNOSIS — N529 Male erectile dysfunction, unspecified: Secondary | ICD-10-CM | POA: Insufficient documentation

## 2024-09-23 MED ORDER — SILDENAFIL CITRATE 100 MG PO TABS: 50.0000 mg | ORAL_TABLET | Freq: Every day | ORAL | 0 refills | Status: DC | PRN

## 2024-09-23 NOTE — Progress Notes (Signed)
   Subjective: Erectile dysfunction    Patient ID: Michael Garrison, male    DOB: 03-04-1976, 48 y.o.   MRN: [id number]  HPI Patient complaining of 2 months of symptoms consistent with erectile dysfunction.  Patient states able to attain erection but unable to maintain to complete intercourse.  Denies any of the urinary complaints.  Patient takes 5 mg of Norvasc  for hypertension.   Review of Systems Hypertension    Objective:   Physical Exam  BP 145/85  Cuff Size Normal  Pulse Rate 62  SpO2 96 %  Physical exam deferred.      Assessment & Plan: Erectile dysfunction    Patient given Viagra  100 mg to use as directed.  Patient will follow-up in 2 weeks.

## 2024-10-07 ENCOUNTER — Other Ambulatory Visit: Payer: Self-pay

## 2024-10-07 ENCOUNTER — Ambulatory Visit: Payer: Self-pay | Admitting: Physician Assistant

## 2024-10-07 ENCOUNTER — Encounter: Payer: Self-pay | Admitting: Physician Assistant

## 2024-10-07 VITALS — BP 134/86 | HR 70 | Resp 14

## 2024-10-07 DIAGNOSIS — N528 Other male erectile dysfunction: Secondary | ICD-10-CM

## 2024-10-07 MED ORDER — SILDENAFIL CITRATE 100 MG PO TABS: 50.0000 mg | ORAL_TABLET | Freq: Every day | ORAL | 11 refills | Status: DC | PRN

## 2024-10-07 NOTE — Progress Notes (Signed)
Pt presents today for follow up.

## 2024-10-07 NOTE — Progress Notes (Signed)
   Subjective: Erectile dysfunction    Patient ID: Michael Garrison, male    DOB: [birth year], 48 y.o.   MRN: [id number]  HPI  Patient is follow-up for erectile dysfunction.  Patient started Viagra  and has noticed improvement at 50 mg.  States that 1 time he took 100 mg and developed flushing and headache.  Denies any adverse reactions with 50 mg. Review of Systems Hypertension    Objective:   Physical Exam  10/07/2024   1:49 PM    BP 134/86  Cuff Size Normal  Pulse Rate 70  Resp 14  SpO2 96 %  Physical exam deferred.       Assessment & Plan: Erectile dysfunction  Continue Viagra  as directed.

## 2024-10-13 ENCOUNTER — Other Ambulatory Visit: Payer: Self-pay | Admitting: Physician Assistant

## 2024-10-13 DIAGNOSIS — N528 Other male erectile dysfunction: Secondary | ICD-10-CM

## 2024-12-03 ENCOUNTER — Other Ambulatory Visit: Payer: Self-pay

## 2024-12-03 DIAGNOSIS — N528 Other male erectile dysfunction: Secondary | ICD-10-CM

## 2024-12-03 MED ORDER — SILDENAFIL CITRATE 100 MG PO TABS: 100.0000 mg | ORAL_TABLET | ORAL | 11 refills | Status: AC | PRN
